# Patient Record
Sex: Female | Born: 1965
Health system: Southern US, Community
[De-identification: ages and names within clinical notes are randomized; demographics above are authoritative.]

## PROBLEM LIST (undated history)

## (undated) DIAGNOSIS — M5126 Other intervertebral disc displacement, lumbar region: Secondary | ICD-10-CM

## (undated) DIAGNOSIS — M5136 Other intervertebral disc degeneration, lumbar region: Secondary | ICD-10-CM

## (undated) DIAGNOSIS — M51369 Other intervertebral disc degeneration, lumbar region without mention of lumbar back pain or lower extremity pain: Secondary | ICD-10-CM

## (undated) DIAGNOSIS — K219 Gastro-esophageal reflux disease without esophagitis: Secondary | ICD-10-CM

## (undated) DIAGNOSIS — C50419 Malignant neoplasm of upper-outer quadrant of unspecified female breast: Principal | ICD-10-CM

## (undated) HISTORY — PX: REDUCTION MAMMAPLASTY: SUR839

## (undated) HISTORY — DX: Other intervertebral disc degeneration, lumbar region: M51.36

## (undated) HISTORY — DX: Malignant neoplasm of upper-outer quadrant of unspecified female breast: C50.419

## (undated) HISTORY — PX: COLONOSCOPY: SHX174

## (undated) HISTORY — DX: Gastro-esophageal reflux disease without esophagitis: K21.9

## (undated) HISTORY — DX: Other intervertebral disc displacement, lumbar region: M51.26

## (undated) HISTORY — PX: OTHER SURGICAL HISTORY: SHX169

## (undated) HISTORY — DX: Other intervertebral disc degeneration, lumbar region without mention of lumbar back pain or lower extremity pain: M51.369

---

## 1995-01-04 HISTORY — PX: BREAST REDUCTION SURGERY: SHX8

## 2001-01-03 HISTORY — PX: CHOLECYSTECTOMY: SHX55

## 2001-01-03 HISTORY — PX: TUBAL LIGATION: SHX77

## 2007-02-16 ENCOUNTER — Ambulatory Visit: Payer: Self-pay | Admitting: Cardiology

## 2011-01-06 DIAGNOSIS — C50419 Malignant neoplasm of upper-outer quadrant of unspecified female breast: Secondary | ICD-10-CM

## 2011-01-06 HISTORY — DX: Malignant neoplasm of upper-outer quadrant of unspecified female breast: C50.419

## 2011-01-10 ENCOUNTER — Telehealth (INDEPENDENT_AMBULATORY_CARE_PROVIDER_SITE_OTHER): Payer: Self-pay | Admitting: Surgery

## 2011-01-10 NOTE — Telephone Encounter (Signed)
Next available date I see is 01/18/11 and that is when patient is scheduled. Dr Jamey Ripa, do you have any time you would like to see her sooner or is a week out okay?

## 2011-01-13 ENCOUNTER — Ambulatory Visit (INDEPENDENT_AMBULATORY_CARE_PROVIDER_SITE_OTHER): Payer: BC Managed Care – PPO | Admitting: Surgery

## 2011-01-13 ENCOUNTER — Encounter (INDEPENDENT_AMBULATORY_CARE_PROVIDER_SITE_OTHER): Payer: Self-pay | Admitting: Surgery

## 2011-01-13 VITALS — BP 128/82 | HR 84 | Temp 98.6°F | Resp 18 | Ht 65.0 in | Wt 185.4 lb

## 2011-01-13 DIAGNOSIS — C50419 Malignant neoplasm of upper-outer quadrant of unspecified female breast: Secondary | ICD-10-CM

## 2011-01-13 NOTE — Telephone Encounter (Signed)
Per Dr Jamey Ripa ok to move appt to Thursday 01/13/11 at 4:00pm. Appt moved and patient made aware.

## 2011-01-13 NOTE — Progress Notes (Signed)
NAME: Sheila Jefferson                                                                                      DOB: 11/20/1965 DATE: 01/13/2011               MRN: [id number]   CC:  Chief Complaint  Patient presents with  . Breast Cancer    HPI:  Sheila Jefferson is a 45 y.o.  female who was referred  by Dr. Howardfor evaluation of A newly diagnosed right breast cancer. The patient noticed a lump near the armpit area about a month ago. She has had inflamed lymph nodes in the past and thought this was another inflamed lymph node. However she did have no fever or pain. It did not get better with time. She called her physician and had a mammogram done. The mammogram showed an abnormality suspicious for cancer with some abnormal lymph nodes in the armpit. Ultrasound confirmed these findings and biopsy has shown invasive carcinoma there still doing studies for receptors and to see if this is Dr. or lobular. The remainder of mammogram was negative.  In 1997 she had bilateral breast reductions. She had a left core biopsy done many years ago for benign process. She's not had any symptoms. She has an aunt who developed postmenopausal breast cancer.  PMH:  has a past medical history of GERD (gastroesophageal reflux disease) and Cancer of upper-outer quadrant of female breast (01/06/2011).  PSH:   has past surgical history that includes Breast reduction surgery (1997) and Cholecystectomy (2003).  ALLERGIES:  Allergies no known allergies  MEDICATIONS: Current outpatient prescriptions:spironolactone (ALDACTONE) 100 MG tablet, Take 100 mg by mouth 2 (two) times daily., Disp: , Rfl:   ROS: She has filled out our 12 point review of systems and it is negative. EXAM:   GENERAL:  The patient is alert, oriented, and generally healthy-appearing, NAD. Mood and affect are normal.  HEENT:  The head is normocephalic, the eyes nonicteric, the pupils were round regular and equal. EOMs are normal. Pharynx normal. Dentition  good.  NECK:  The neck is supple and there are no masses or thyromegaly.  LUNGS: Normal respirations and clear to auscultation.  HEART: Regular rhythm, with no murmurs rubs or gallops. Pulses are intact carotid dorsalis pedis and posterior tibial. No significant varicosities are noted.  BREASTS: There is a hard 3 cm mass in the R UOQ basically in the axilla and some tender axillary nodes. The left side is nl s/p reduction  LYMPHATICS: R Axill nodes, not fixed , <3cm, none on left or supraclavicular  ABDOMEN: Soft, flat, and nontender. No masses or organomegaly is noted. No hernias are noted. Bowel sounds are normal.  EXTREMITIES:  Good range of motion, no edema.   DATA REVIEWED:  Mammogram films and reports, ultrasound and path reports. The prognostic profile and ane- carherin stain are pending  IMPRESSION:  Stage II, right breast cancer  PLAN:   I think we need to have an MRI to be sure there is not another lesion in her right breast or one in the left. The palpable cancer is so high towards   the axilla I'm concerned it could represent a lymph node replaced by cancer and there is a primary someplace else in the breast. In addition, she is likely going to have to have chemotherapy followed like her to have an oncology visit preoperatively so that we could put a port and if needed. Some consideration could also be given for neoadjuvant therapy.  I have told her this is a stage II breast cancer and described the findings for that. I also talked to her about options including lumpectomies x-ray dissections mastectomies reconstructions chemotherapy and radiation therapy.  Currently I think we can do a lumpectomy and node dissection, probably through the same incision.  I put in order in for the MRI and will see if I can get an oncology visit for her fairly soon.  Altovise Wahler J 01/13/2011  CC: Howard, Kevin P, MD, HOWARD,KEVIN P, MD        

## 2011-01-13 NOTE — Patient Instructions (Addendum)
I think the next two things that we need to do our to obtain an MRI exam of her breast to be sure there is nothing that we are missing on either side. As we discussed, most likely  going to need some chemotherapy so I want to have one of the oncologists see you prior to surgery.

## 2011-01-17 ENCOUNTER — Other Ambulatory Visit (INDEPENDENT_AMBULATORY_CARE_PROVIDER_SITE_OTHER): Payer: Self-pay | Admitting: Surgery

## 2011-01-18 ENCOUNTER — Encounter (INDEPENDENT_AMBULATORY_CARE_PROVIDER_SITE_OTHER): Payer: BC Managed Care – PPO | Admitting: Surgery

## 2011-01-19 ENCOUNTER — Ambulatory Visit
Admission: RE | Admit: 2011-01-19 | Discharge: 2011-01-19 | Disposition: A | Discharge status: Home / Self Care | Payer: BC Managed Care – PPO | Source: Ambulatory Visit | Attending: Surgery | Admitting: Surgery

## 2011-01-19 ENCOUNTER — Telehealth (INDEPENDENT_AMBULATORY_CARE_PROVIDER_SITE_OTHER): Payer: Self-pay | Admitting: General Surgery

## 2011-01-19 ENCOUNTER — Telehealth: Payer: Self-pay | Admitting: *Deleted

## 2011-01-19 ENCOUNTER — Encounter (INDEPENDENT_AMBULATORY_CARE_PROVIDER_SITE_OTHER): Payer: Self-pay

## 2011-01-19 DIAGNOSIS — C50419 Malignant neoplasm of upper-outer quadrant of unspecified female breast: Secondary | ICD-10-CM

## 2011-01-19 MED ORDER — GADOBENATE DIMEGLUMINE 529 MG/ML IV SOLN
17.0000 mL | Freq: Once | INTRAVENOUS | Status: AC | PRN
  Administered 2011-01-19: 17 mL via INTRAVENOUS

## 2011-01-19 NOTE — Telephone Encounter (Signed)
Called pt and she said that she would like to see Dr. Mariel Sleet.  I faxed paperwork to Dr. Thornton Papas office to schedule.

## 2011-01-19 NOTE — Telephone Encounter (Signed)
Patient called, she is having MR today and has heard from oncology and scheduled appt with Dr Mariel Sleet 01/31/11. She is wanting to know what her follow up will be like with [place].  After reading her note from Dr Jamey Ripa, I called patient and discussed with her that after her MR the radiologist will read it and get a report to Dr Jamey Ripa and he will call her with the results. She will then see Dr Mariel Sleet to discuss possible neoadjuvant therapy. She will see [place] after she talks with Dr Mariel Sleet. Patient was very frustrated with the slowness of the process and really wants to proceed with treatment. I told her as soon as we get the MR report we will call her.

## 2011-01-20 ENCOUNTER — Telehealth (INDEPENDENT_AMBULATORY_CARE_PROVIDER_SITE_OTHER): Payer: Self-pay | Admitting: Surgery

## 2011-01-20 ENCOUNTER — Other Ambulatory Visit (INDEPENDENT_AMBULATORY_CARE_PROVIDER_SITE_OTHER): Payer: Self-pay | Admitting: Surgery

## 2011-01-20 DIAGNOSIS — N63 Unspecified lump in unspecified breast: Secondary | ICD-10-CM

## 2011-01-20 NOTE — Telephone Encounter (Signed)
Talked to her about MRI report and she remains interested in breast conservation surgery. We will arrange biopsies of the other areas of abnormality

## 2011-01-21 ENCOUNTER — Other Ambulatory Visit (INDEPENDENT_AMBULATORY_CARE_PROVIDER_SITE_OTHER): Payer: Self-pay | Admitting: Surgery

## 2011-01-21 DIAGNOSIS — N63 Unspecified lump in unspecified breast: Secondary | ICD-10-CM

## 2011-01-24 ENCOUNTER — Encounter (INDEPENDENT_AMBULATORY_CARE_PROVIDER_SITE_OTHER): Payer: Self-pay | Admitting: Family Medicine

## 2011-01-25 ENCOUNTER — Ambulatory Visit
Admission: RE | Admit: 2011-01-25 | Discharge: 2011-01-25 | Disposition: A | Discharge status: Home / Self Care | Payer: BC Managed Care – PPO | Source: Ambulatory Visit | Attending: Surgery | Admitting: Surgery

## 2011-01-25 ENCOUNTER — Encounter (INDEPENDENT_AMBULATORY_CARE_PROVIDER_SITE_OTHER): Payer: Self-pay

## 2011-01-25 DIAGNOSIS — N63 Unspecified lump in unspecified breast: Secondary | ICD-10-CM

## 2011-01-25 MED ORDER — GADOBENATE DIMEGLUMINE 529 MG/ML IV SOLN
17.0000 mL | Freq: Once | INTRAVENOUS | Status: AC | PRN
  Administered 2011-01-25: 17 mL via INTRAVENOUS

## 2011-01-31 ENCOUNTER — Encounter (HOSPITAL_COMMUNITY): Payer: Self-pay | Admitting: Oncology

## 2011-01-31 ENCOUNTER — Encounter (HOSPITAL_COMMUNITY): Payer: BC Managed Care – PPO | Attending: Oncology | Admitting: Oncology

## 2011-01-31 ENCOUNTER — Encounter (INDEPENDENT_AMBULATORY_CARE_PROVIDER_SITE_OTHER): Payer: Self-pay | Admitting: Surgery

## 2011-01-31 ENCOUNTER — Other Ambulatory Visit (INDEPENDENT_AMBULATORY_CARE_PROVIDER_SITE_OTHER): Payer: Self-pay | Admitting: Surgery

## 2011-01-31 VITALS — BP 118/75 | HR 85 | Temp 98.0°F | Ht 65.5 in | Wt 188.2 lb

## 2011-01-31 DIAGNOSIS — C50419 Malignant neoplasm of upper-outer quadrant of unspecified female breast: Secondary | ICD-10-CM | POA: Insufficient documentation

## 2011-01-31 DIAGNOSIS — C773 Secondary and unspecified malignant neoplasm of axilla and upper limb lymph nodes: Secondary | ICD-10-CM

## 2011-01-31 LAB — COMPREHENSIVE METABOLIC PANEL
Albumin: 3.6 g/dL (ref 3.5–5.2)
Alkaline Phosphatase: 79 U/L (ref 39–117)
BUN: 12 mg/dL (ref 6–23)
CO2: 24 mEq/L (ref 19–32)
Chloride: 102 mEq/L (ref 96–112)
GFR calc Af Amer: 86 mL/min — ABNORMAL LOW (ref >90)
Glucose, Bld: 98 mg/dL (ref 70–99)
Potassium: 4.1 mEq/L (ref 3.5–5.1)
Total Bilirubin: 0.2 mg/dL — ABNORMAL LOW (ref 0.3–1.2)

## 2011-01-31 LAB — DIFFERENTIAL
Lymphocytes Relative: 31 % (ref 12–46)
Lymphs Abs: 3.9 10*3/uL (ref 0.7–4.0)
Monocytes Relative: 6 % (ref 3–12)
Neutro Abs: 7.7 10*3/uL (ref 1.7–7.7)
Neutrophils Relative %: 62 % (ref 43–77)

## 2011-01-31 LAB — CBC
Hemoglobin: 14.7 g/dL (ref 12.0–15.0)
RBC: 4.63 MIL/uL (ref 3.87–5.11)

## 2011-01-31 NOTE — Progress Notes (Signed)
Discussed with Dr Mariel Sleet - will schedule right lumpectomy and node dissection plus port placement.

## 2011-01-31 NOTE — Progress Notes (Signed)
Sheila Jefferson presented for [employer]. Labs per MD order drawn via Peripheral Line 23 gauge needle inserted in right hand  Good blood return present. Procedure without incident.  Needle removed intact. Patient tolerated procedure well.

## 2011-01-31 NOTE — Progress Notes (Signed)
This office note has been dictated.

## 2011-01-31 NOTE — Progress Notes (Signed)
CC:   Sheila Flavin, MD Sheila Jefferson, M.D. Sheila Jefferson, M.D.  DIAGNOSES: 1. Clinical stage IIA invasive ductal carcinoma of the right breast     with a positive lymph node biopsy for metastatic disease, ER     positive 90%, PR positive 80%, Ki-67 marker 71%, HER-2/neu is non-     amplified. 2. Chronic obstructive pulmonary disease secondary to longstanding     smoking history since her teenage years of just under a pack a day. 3. History of tubal ligation. 4. History of cholecystectomy. 5. History of pilonidal cyst surgery x3, the last in 1983. 6. History of reflux symptomatology on Nexium. 7. Anxiety, acute, most likely secondary to this breast cancer. 8. History of bilateral breast reduction surgery by Dr. Shon Jefferson in     the 1990s.  HISTORY:  This is a very pleasant 46 year old woman who noticed a lump in her right axillary/upper outer quadrant breast area 1-2 weeks before Christmas.  She thought it went away and then while on vacation she found it again, so she came back, had a mammogram with the help of Dr. Dimas Jefferson who then sent her to Dr. Jamey Jefferson after this mass was found suspicious for breast cancer.  She underwent a biopsy, I believe, by Dr. Christiana Jefferson, both of the breast and the axillary node.  She had an MRI which showed 2 other areas.  They were biopsied under MRI guidance. There were both benign in the right breast, one was in the retroareolar region, one was at the 6 o'clock position.  She was referred here today by Dr. Jamey Jefferson for evaluation and possible therapeutic intervention.  She has a family history, her maternal aunt had breast cancer in her 3s and a maternal uncle's daughter had breast cancer in her 59s, so I did discuss with Sheila Jefferson a genetics counseling consultation which she will think about.  She and her husband have been married 26 years.  They have 2 daughters, ages 73 and 91, both in good health.  Sheila Jefferson is a smoker of many, many years  starting in her early teenage years, just under a pack a day is what she admits to.  She does not drink alcohol.  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  Her parents are both deceased.  Her father died of complications of diabetes and renal failure.  Her mother did have vaginal carcinoma in the early 1960s but was cured of that.  She did go on to have cardiac mitral valve surgery x2, and I believe died of complications of heart-related disease.  REVIEW OF SYSTEMS:  Otherwise noncontributory today.  PHYSICAL EXAM:  General:  Pleasant woman who looks her stated age of 36, no acute distress.  She does wear glasses.  Vital signs:  Weight is 188 pounds.  She is 5 feet 5-1/2 inches tall.  BMI is 30.9, blood pressure 118/75, left arm sitting position, pulse 80 and regular, respiratory rate 16 and unlabored.  She is afebrile.  She does have a few rhonchi at the left lower base which were transient.  Otherwise she had mildly diminished breath sounds throughout.  No rubs or rales.  She had no adenopathy in the cervical, supraclavicular, infraclavicular, or axillary areas.  I could not feel definitive node in the right axilla. There was a question of very high in the axilla, but that was questionable only.  In the right breast upper outer quadrant there  is a 2 x 1.5 cm mass that is palpable, very firm, consistent with a tumor and then at the 9 o'clock position just at the edge of the areolar-nipple complex, she has an area of thickening about 2 cm across close to an ecchymosis which I suspect is what this is from the previous biopsy. She actually has 2 ecchymoses on the right breast lower outer quadrant. She has no masses in the left breast.  She has breast reduction surgery scars.  Heart:  Shows a regular rhythm and rate without murmur rub or gallop.  Abdomen:  Soft, nontender without hepatosplenomegaly.  Bowel sounds  decreased but fairly normal.  She has no peripheral edema of the arms, legs.  She is right-handed.  Pulses are intact.  She does have very prominent palmar erythema but again does not drink any alcohol.  I think this lady could easily have a definitive surgical procedure presently with the idea of saving her breast.  She had a lumpectomy and axillary node dissection with Dr. Jamey Jefferson so she is fully staged and I think at her age and with a positive lymph node, she is going to need chemotherapy.  She has a grade 2 tumor.  She has a high Ki-67 marker and even though she is ER/PR positive, I think at her age she warrants chemotherapy and I think dose-dense AC followed by T would be very reasonable.  We will get a baseline bone scan.  Because of her smoking, she should probably have a CT of the chest anyway, so we will do CT chest, abdomen and pelvis, get her back to Dr. Jamey Jefferson who I spoke with today.  His office will be in contact with her about getting surgery set up.  I have asked her to quit smoking.  She will try her best, she states. She understands that her wound healing will definitely be better and she will do better with chemotherapy if she can quit smoking.  We will see her back one way or the other after her definitive surgery, she will let us know.  Then we will also have to set her up with a consultation and we will try to get Dr. Lurline Jefferson to see her in the near future as well.    ______________________________ Sheila Jefferson. Mariel Sleet, MD ESN/MEDQ  D:  01/31/2011  T:  01/31/2011  Job:  [id number]

## 2011-01-31 NOTE — Patient Instructions (Signed)
Sheila Jefferson  [id number] 14-Aug-1965   [facility]  Discharge Instructions  RECOMMENDATIONS MADE BY THE CONSULTANT AND ANY TEST RESULTS WILL BE SENT TO YOUR REFERRING DOCTOR.   EXAM FINDINGS BY MD TODAY AND SIGNS AND SYMPTOMS TO REPORT TO CLINIC OR PRIMARY MD: You will need chemotherapy but we will do it after you have definitive surgery.  Need to do CT scans of your chest, abdomen and pelvis and a bone scan for staging.  Need to stop smoking to help with healing post-operatively and you would tolerate chemotherapy better if you were not smoking.  We can also get you set up for genetic counseling if you  MEDICATIONS PRESCRIBED: none    SPECIAL INSTRUCTIONS/FOLLOW-UP: CT scans of Chest, abdomen and pelvis and bone scan to be done tomorrow.  Call Tobie Lords, RN [phone]) when you know your surgery date so we can schedule a follow-up visit 2 weeks after.    I acknowledge that I have been informed and understand all the instructions given to me and received a copy. I do not have any more questions at this time, but understand that I may call the Specialty Clinic at [facility] at [phone] during business hours should I have any further questions or need assistance in obtaining follow-up care.    __________________________________________  _____________  __________ Signature of Patient or Authorized Representative            Date                   Time    __________________________________________ Nurse's Signature

## 2011-02-01 ENCOUNTER — Encounter (HOSPITAL_COMMUNITY)
Admission: RE | Admit: 2011-02-01 | Discharge: 2011-02-01 | Disposition: A | Discharge status: Home / Self Care | Payer: BC Managed Care – PPO | Source: Ambulatory Visit | Attending: Oncology | Admitting: Oncology

## 2011-02-01 ENCOUNTER — Ambulatory Visit (HOSPITAL_COMMUNITY)
Admission: RE | Admit: 2011-02-01 | Discharge: 2011-02-01 | Disposition: A | Discharge status: Home / Self Care | Payer: BC Managed Care – PPO | Source: Ambulatory Visit | Attending: Oncology | Admitting: Oncology

## 2011-02-01 DIAGNOSIS — C50419 Malignant neoplasm of upper-outer quadrant of unspecified female breast: Secondary | ICD-10-CM

## 2011-02-01 DIAGNOSIS — N83209 Unspecified ovarian cyst, unspecified side: Secondary | ICD-10-CM | POA: Insufficient documentation

## 2011-02-01 DIAGNOSIS — C50919 Malignant neoplasm of unspecified site of unspecified female breast: Secondary | ICD-10-CM | POA: Insufficient documentation

## 2011-02-01 DIAGNOSIS — C773 Secondary and unspecified malignant neoplasm of axilla and upper limb lymph nodes: Secondary | ICD-10-CM | POA: Insufficient documentation

## 2011-02-01 LAB — CANCER ANTIGEN 27.29: CA 27.29: 23 U/mL (ref 0–39)

## 2011-02-01 MED ORDER — TECHNETIUM TC 99M MEDRONATE IV KIT
25.0000 | PACK | Freq: Once | INTRAVENOUS | Status: AC | PRN
  Administered 2011-02-01: 27 via INTRAVENOUS

## 2011-02-01 MED ORDER — IOHEXOL 300 MG/ML  SOLN
100.0000 mL | Freq: Once | INTRAMUSCULAR | Status: AC | PRN
  Administered 2011-02-01: 100 mL via INTRAVENOUS

## 2011-02-03 ENCOUNTER — Telehealth (INDEPENDENT_AMBULATORY_CARE_PROVIDER_SITE_OTHER): Payer: Self-pay | Admitting: General Surgery

## 2011-02-03 NOTE — Telephone Encounter (Signed)
Patient called and left a very frustrated message on my voicemail stating she has been scheduled for surgery 02/23/11. She wants surgery done sooner. Please advise if there is anytime to move patient sooner.

## 2011-02-04 ENCOUNTER — Encounter (HOSPITAL_COMMUNITY): Payer: Self-pay | Admitting: Pharmacy Technician

## 2011-02-04 ENCOUNTER — Telehealth (HOSPITAL_COMMUNITY): Payer: Self-pay

## 2011-02-04 NOTE — Telephone Encounter (Signed)
Patient has been scheduled to see Dr. Mariel Sleet 2 weeks following surgery.

## 2011-02-04 NOTE — Telephone Encounter (Signed)
Call from patient, will be having breast surgery and port placement on 02/14/11.   When does chemotherapy need to be started?

## 2011-02-09 ENCOUNTER — Encounter (HOSPITAL_COMMUNITY): Payer: Self-pay

## 2011-02-09 ENCOUNTER — Encounter (HOSPITAL_COMMUNITY)
Admission: RE | Admit: 2011-02-09 | Discharge: 2011-02-09 | Disposition: A | Discharge status: Home / Self Care | Payer: BC Managed Care – PPO | Source: Ambulatory Visit | Attending: Surgery | Admitting: Surgery

## 2011-02-09 LAB — BASIC METABOLIC PANEL
BUN: 8 mg/dL (ref 6–23)
GFR calc non Af Amer: >90 mL/min (ref >90)
Glucose, Bld: 114 mg/dL — ABNORMAL HIGH (ref 70–99)
Potassium: 4 mEq/L (ref 3.5–5.1)

## 2011-02-09 LAB — HCG, SERUM, QUALITATIVE: Preg, Serum: NEGATIVE

## 2011-02-09 LAB — CBC
MCV: 91.4 fL (ref 78.0–100.0)
Platelets: 275 10*3/uL (ref 150–400)
RDW: 13.6 % (ref 11.5–15.5)
WBC: 12.4 10*3/uL — ABNORMAL HIGH (ref 4.0–10.5)

## 2011-02-09 MED ORDER — CHLORHEXIDINE GLUCONATE 4 % EX LIQD: 1.0000 "application " | Freq: Once | CUTANEOUS | Status: DC

## 2011-02-09 NOTE — Pre-Procedure Instructions (Signed)
18 Sheila Jefferson  02/09/2011   Your procedure is scheduled on:  Feb 11, 2011 (Friday)  Report to [facility] at 1000 AM.  Call this number if you have problems the morning of surgery: [phone]   Remember:   Do not eat food:After Midnight.  May have clear liquids: up to 4 Hours before arrival.  Clear liquids include soda, tea, black coffee, apple or grape juice, broth.  Take these medicines the morning of surgery with A SIP OF WATER: xanax, nexium, vicodin   Do not wear jewelry, make-up or nail polish.  Do not wear lotions, powders, or perfumes. You may wear deodorant.  Do not shave 48 hours prior to surgery.  Do not bring valuables to the hospital.  Contacts, dentures or bridgework may not be worn into surgery.  Leave suitcase in the car. After surgery it may be brought to your room.  For patients admitted to the hospital, checkout time is 11:00 AM the day of discharge.   Patients discharged the day of surgery will not be allowed to drive home.  Name and phone number of your driver: NA  Special Instructions: CHG Shower Use Special Wash: 1/2 bottle night before surgery and 1/2 bottle morning of surgery.   Please read over the following fact sheets that you were given: Pain Booklet, MRSA Information and Surgical Site Infection Prevention

## 2011-02-10 MED ORDER — CEFAZOLIN SODIUM-DEXTROSE 2-3 GM-% IV SOLR
2.0000 g | INTRAVENOUS | Status: AC
  Administered 2011-02-11: 2 g via INTRAVENOUS
  Filled 2011-02-10: qty 50

## 2011-02-11 ENCOUNTER — Encounter (HOSPITAL_COMMUNITY): Payer: Self-pay | Admitting: Anesthesiology

## 2011-02-11 ENCOUNTER — Encounter (HOSPITAL_COMMUNITY): Payer: Self-pay | Admitting: *Deleted

## 2011-02-11 ENCOUNTER — Ambulatory Visit (HOSPITAL_COMMUNITY): Payer: BC Managed Care – PPO

## 2011-02-11 ENCOUNTER — Ambulatory Visit (HOSPITAL_COMMUNITY): Payer: BC Managed Care – PPO | Admitting: Anesthesiology

## 2011-02-11 ENCOUNTER — Encounter (HOSPITAL_COMMUNITY)
Admission: RE | Disposition: A | Discharge status: Home / Self Care | Payer: Self-pay | Source: Ambulatory Visit | Attending: Surgery

## 2011-02-11 ENCOUNTER — Other Ambulatory Visit (INDEPENDENT_AMBULATORY_CARE_PROVIDER_SITE_OTHER): Payer: Self-pay | Admitting: Surgery

## 2011-02-11 ENCOUNTER — Encounter (HOSPITAL_COMMUNITY): Payer: Self-pay | Admitting: General Practice

## 2011-02-11 ENCOUNTER — Ambulatory Visit (HOSPITAL_COMMUNITY)
Admission: RE | Admit: 2011-02-11 | Discharge: 2011-02-12 | Disposition: A | Discharge status: Home / Self Care | Payer: BC Managed Care – PPO | Source: Ambulatory Visit | Attending: Surgery | Admitting: Surgery

## 2011-02-11 DIAGNOSIS — C50419 Malignant neoplasm of upper-outer quadrant of unspecified female breast: Secondary | ICD-10-CM | POA: Insufficient documentation

## 2011-02-11 DIAGNOSIS — Z01812 Encounter for preprocedural laboratory examination: Secondary | ICD-10-CM | POA: Insufficient documentation

## 2011-02-11 DIAGNOSIS — C773 Secondary and unspecified malignant neoplasm of axilla and upper limb lymph nodes: Secondary | ICD-10-CM | POA: Insufficient documentation

## 2011-02-11 HISTORY — PX: PORTACATH PLACEMENT: SHX2246

## 2011-02-11 HISTORY — PX: BREAST LUMPECTOMY: SHX2

## 2011-02-11 SURGERY — BREAST LUMPECTOMY WITH AXILLARY LYMPH NODE DISSECTION
Anesthesia: General | Site: Chest | Laterality: Right | Wound class: Clean

## 2011-02-11 MED ORDER — MIDAZOLAM HCL 2 MG/2ML IJ SOLN: 2.0000 mg | Freq: Once | INTRAMUSCULAR | Status: DC

## 2011-02-11 MED ORDER — LACTATED RINGERS IV SOLN
INTRAVENOUS | Status: DC
  Administered 2011-02-11: 11:00:00 via INTRAVENOUS

## 2011-02-11 MED ORDER — SODIUM CHLORIDE 0.9 % IR SOLN
Status: DC | PRN
  Administered 2011-02-11: 13:00:00

## 2011-02-11 MED ORDER — ALPRAZOLAM 0.5 MG PO TABS: 0.5000 mg | ORAL_TABLET | Freq: Every day | ORAL | Status: DC | PRN

## 2011-02-11 MED ORDER — ONDANSETRON HCL 4 MG/2ML IJ SOLN: 4.0000 mg | Freq: Four times a day (QID) | INTRAMUSCULAR | Status: DC | PRN

## 2011-02-11 MED ORDER — HYDROCODONE-ACETAMINOPHEN 5-325 MG PO TABS
1.0000 | ORAL_TABLET | ORAL | Status: DC | PRN
  Administered 2011-02-11: 2 via ORAL
  Filled 2011-02-11: qty 2

## 2011-02-11 MED ORDER — DEXAMETHASONE SODIUM PHOSPHATE 10 MG/ML IJ SOLN
INTRAMUSCULAR | Status: DC | PRN
  Administered 2011-02-11: 8 mg via INTRAVENOUS

## 2011-02-11 MED ORDER — KCL-LACTATED RINGERS-D5W 20 MEQ/L IV SOLN
INTRAVENOUS | Status: DC
  Administered 2011-02-11 – 2011-02-12 (×2): via INTRAVENOUS
  Filled 2011-02-11 (×3): qty 1000

## 2011-02-11 MED ORDER — HYDROMORPHONE HCL PF 1 MG/ML IJ SOLN: 2.0000 mg | INTRAMUSCULAR | Status: DC | PRN

## 2011-02-11 MED ORDER — FENTANYL CITRATE 0.05 MG/ML IJ SOLN
INTRAMUSCULAR | Status: DC | PRN
  Administered 2011-02-11 (×3): 50 ug via INTRAVENOUS
  Administered 2011-02-11: 100 ug via INTRAVENOUS
  Administered 2011-02-11 (×2): 50 ug via INTRAVENOUS

## 2011-02-11 MED ORDER — LACTATED RINGERS IV SOLN
INTRAVENOUS | Status: DC | PRN
  Administered 2011-02-11 (×2): via INTRAVENOUS

## 2011-02-11 MED ORDER — ONDANSETRON HCL 4 MG/2ML IJ SOLN
INTRAMUSCULAR | Status: DC | PRN
  Administered 2011-02-11: 4 mg via INTRAVENOUS

## 2011-02-11 MED ORDER — PANTOPRAZOLE SODIUM 40 MG PO TBEC
40.0000 mg | DELAYED_RELEASE_TABLET | Freq: Every day | ORAL | Status: DC
  Administered 2011-02-11: 40 mg via ORAL
  Filled 2011-02-11: qty 1

## 2011-02-11 MED ORDER — SPIRONOLACTONE 100 MG PO TABS
100.0000 mg | ORAL_TABLET | Freq: Two times a day (BID) | ORAL | Status: DC
  Administered 2011-02-11: 100 mg via ORAL
  Filled 2011-02-11 (×3): qty 1

## 2011-02-11 MED ORDER — HYDROCODONE-ACETAMINOPHEN 5-325 MG PO TABS: 1.0000 | ORAL_TABLET | ORAL | Status: DC | PRN

## 2011-02-11 MED ORDER — LIDOCAINE HCL (CARDIAC) 20 MG/ML IV SOLN
INTRAVENOUS | Status: DC | PRN
  Administered 2011-02-11: 50 mg via INTRAVENOUS

## 2011-02-11 MED ORDER — MIDAZOLAM HCL 5 MG/5ML IJ SOLN
INTRAMUSCULAR | Status: DC | PRN
  Administered 2011-02-11: 2 mg via INTRAVENOUS

## 2011-02-11 MED ORDER — ONDANSETRON HCL 4 MG PO TABS: 4.0000 mg | ORAL_TABLET | Freq: Four times a day (QID) | ORAL | Status: DC | PRN

## 2011-02-11 MED ORDER — DROPERIDOL 2.5 MG/ML IJ SOLN
INTRAMUSCULAR | Status: DC | PRN
  Administered 2011-02-11: 0.625 mg via INTRAVENOUS

## 2011-02-11 MED ORDER — BUPIVACAINE HCL (PF) 0.25 % IJ SOLN: INTRAMUSCULAR | Status: DC | PRN

## 2011-02-11 MED ORDER — IOHEXOL 300 MG/ML  SOLN
INTRAMUSCULAR | Status: DC | PRN
  Administered 2011-02-11: 1 mL via INTRAVENOUS

## 2011-02-11 MED ORDER — BUPIVACAINE HCL (PF) 0.25 % IJ SOLN
INTRAMUSCULAR | Status: DC | PRN
  Administered 2011-02-11: 30 mL

## 2011-02-11 MED ORDER — PROPOFOL 10 MG/ML IV EMUL
INTRAVENOUS | Status: DC | PRN
  Administered 2011-02-11: 200 mg via INTRAVENOUS

## 2011-02-11 MED ORDER — 0.9 % SODIUM CHLORIDE (POUR BTL) OPTIME
TOPICAL | Status: DC | PRN
  Administered 2011-02-11: 1000 mL

## 2011-02-11 MED ORDER — HEPARIN SOD (PORK) LOCK FLUSH 100 UNIT/ML IV SOLN
INTRAVENOUS | Status: DC | PRN
  Administered 2011-02-11: 500 [IU] via INTRAVENOUS

## 2011-02-11 MED ORDER — HYDROMORPHONE HCL PF 1 MG/ML IJ SOLN
0.2500 mg | INTRAMUSCULAR | Status: DC | PRN
  Administered 2011-02-11: 0.5 mg via INTRAVENOUS
  Administered 2011-02-11: 16:00:00 via INTRAVENOUS

## 2011-02-11 SURGICAL SUPPLY — 76 items
ADH SKN CLS APL DERMABOND .7 (GAUZE/BANDAGES/DRESSINGS) ×2
APPLIER CLIP 9.375 MED OPEN (MISCELLANEOUS) ×3
APPLIER CLIP 9.375 SM OPEN (CLIP) ×3
APR CLP MED 9.3 20 MLT OPN (MISCELLANEOUS) ×1
APR CLP SM 9.3 20 MLT OPN (CLIP) ×1
BAG DECANTER FOR FLEXI CONT (MISCELLANEOUS) ×3 IMPLANT
BINDER BREAST LRG (GAUZE/BANDAGES/DRESSINGS) IMPLANT
BINDER BREAST XLRG (GAUZE/BANDAGES/DRESSINGS) IMPLANT
BIOPATCH RED 1 DISK 7.0 (GAUZE/BANDAGES/DRESSINGS) ×3 IMPLANT
BLADE SURG 10 STRL SS (BLADE) ×3 IMPLANT
BLADE SURG 15 STRL LF DISP TIS (BLADE) ×2 IMPLANT
BLADE SURG 15 STRL SS (BLADE) ×1
BLADE SURG CLIPPER 3M 9600 (MISCELLANEOUS) ×3 IMPLANT
CANISTER SUCTION 2500CC (MISCELLANEOUS) ×3 IMPLANT
CHLORAPREP W/TINT 10.5 ML (MISCELLANEOUS) ×3 IMPLANT
CHLORAPREP W/TINT 26ML (MISCELLANEOUS) ×3 IMPLANT
CLIP APPLIE 9.375 MED OPEN (MISCELLANEOUS) ×2 IMPLANT
CLIP APPLIE 9.375 SM OPEN (CLIP) ×2 IMPLANT
CLOTH BEACON ORANGE TIMEOUT ST (SAFETY) ×3 IMPLANT
CONT SPEC 4OZ CLIKSEAL STRL BL (MISCELLANEOUS) ×6 IMPLANT
COVER SURGICAL LIGHT HANDLE (MISCELLANEOUS) ×6 IMPLANT
CRADLE DONUT ADULT HEAD (MISCELLANEOUS) ×3 IMPLANT
DECANTER SPIKE VIAL GLASS SM (MISCELLANEOUS) ×3 IMPLANT
DERMABOND ADVANCED (GAUZE/BANDAGES/DRESSINGS) ×2
DERMABOND ADVANCED .7 DNX12 (GAUZE/BANDAGES/DRESSINGS) ×4 IMPLANT
DEVICE DUBIN SPECIMEN MAMMOGRA (MISCELLANEOUS) ×3 IMPLANT
DRAIN CHANNEL 19F RND (DRAIN) ×3 IMPLANT
DRAPE C-ARM 42X72 X-RAY (DRAPES) ×3 IMPLANT
DRAPE CHEST BREAST 15X10 FENES (DRAPES) ×3 IMPLANT
DRAPE LAPAROTOMY TRNSV 102X78 (DRAPE) ×3 IMPLANT
DRAPE UTILITY 15X26 W/TAPE STR (DRAPE) ×12 IMPLANT
ELECT CAUTERY BLADE 6.4 (BLADE) ×3 IMPLANT
ELECT REM PT RETURN 9FT ADLT (ELECTROSURGICAL) ×3
ELECTRODE REM PT RTRN 9FT ADLT (ELECTROSURGICAL) ×2 IMPLANT
EVACUATOR SILICONE 100CC (DRAIN) ×3 IMPLANT
GAUZE SPONGE 4X4 16PLY XRAY LF (GAUZE/BANDAGES/DRESSINGS) ×3 IMPLANT
GLOVE BIO SURGEON STRL SZ7.5 (GLOVE) ×9 IMPLANT
GLOVE BIOGEL PI IND STRL 7.0 (GLOVE) ×2 IMPLANT
GLOVE BIOGEL PI IND STRL 7.5 (GLOVE) ×6 IMPLANT
GLOVE BIOGEL PI INDICATOR 7.0 (GLOVE) ×1
GLOVE BIOGEL PI INDICATOR 7.5 (GLOVE) ×3
GLOVE EUDERMIC 7 POWDERFREE (GLOVE) ×3 IMPLANT
GLOVE SURG SS PI 6.5 STRL IVOR (GLOVE) ×3 IMPLANT
GOWN PREVENTION PLUS XLARGE (GOWN DISPOSABLE) ×3 IMPLANT
GOWN STRL NON-REIN LRG LVL3 (GOWN DISPOSABLE) ×9 IMPLANT
INTRODUCER 13FR (MISCELLANEOUS) IMPLANT
INTRODUCER COOK 11FR (CATHETERS) IMPLANT
KIT BASIN OR (CUSTOM PROCEDURE TRAY) ×3 IMPLANT
KIT MARKER MARGIN INK (KITS) ×3 IMPLANT
KIT PORT POWER 9.6FR MRI PREA (Catheter) IMPLANT
KIT PORT POWER ISP 8FR (Catheter) IMPLANT
KIT POWER CATH 8FR (Catheter) ×3 IMPLANT
KIT ROOM TURNOVER OR (KITS) ×3 IMPLANT
NEEDLE HYPO 25GX1X1/2 BEV (NEEDLE) ×6 IMPLANT
NS IRRIG 1000ML POUR BTL (IV SOLUTION) ×3 IMPLANT
PACK SURGICAL SETUP 50X90 (CUSTOM PROCEDURE TRAY) ×3 IMPLANT
PAD ARMBOARD 7.5X6 YLW CONV (MISCELLANEOUS) ×6 IMPLANT
PENCIL BUTTON HOLSTER BLD 10FT (ELECTRODE) ×3 IMPLANT
SET INTRODUCER 12FR PACEMAKER (SHEATH) IMPLANT
SET SHEATH INTRODUCER 10FR (MISCELLANEOUS) IMPLANT
SHEATH COOK PEEL AWAY SET 9F (SHEATH) IMPLANT
SPONGE INTESTINAL PEANUT (DISPOSABLE) ×3 IMPLANT
SPONGE LAP 18X18 X RAY DECT (DISPOSABLE) IMPLANT
SPONGE LAP 4X18 X RAY DECT (DISPOSABLE) ×3 IMPLANT
SUT ETHILON 3 0 FSL (SUTURE) ×3 IMPLANT
SUT MNCRL AB 4-0 PS2 18 (SUTURE) ×3 IMPLANT
SUT MON AB 4-0 PC3 18 (SUTURE) ×6 IMPLANT
SUT PROLENE 2 0 SH DA (SUTURE) ×6 IMPLANT
SUT VIC AB 3-0 SH 18 (SUTURE) ×6 IMPLANT
SYR 20ML ECCENTRIC (SYRINGE) ×6 IMPLANT
SYR 5ML LUER SLIP (SYRINGE) ×3 IMPLANT
SYR CONTROL 10ML LL (SYRINGE) ×6 IMPLANT
SYRINGE 10CC LL (SYRINGE) ×3 IMPLANT
TOWEL OR 17X24 6PK STRL BLUE (TOWEL DISPOSABLE) ×3 IMPLANT
TOWEL OR 17X26 10 PK STRL BLUE (TOWEL DISPOSABLE) ×3 IMPLANT
YANKAUER SUCT BULB TIP NO VENT (SUCTIONS) IMPLANT

## 2011-02-11 NOTE — Anesthesia Preprocedure Evaluation (Addendum)
Anesthesia Evaluation  Patient identified by MRN, date of birth, ID band Patient awake    Reviewed: Allergy & Precautions, H&P , NPO status , Patient's Chart, lab work & pertinent test results  History of Anesthesia Complications Negative for: history of anesthetic complications  Airway Mallampati: II  Neck ROM: full    Dental   Pulmonary Current Smoker (1/2 ppd x 30 years),          Cardiovascular     Neuro/Psych    GI/Hepatic GERD-  Medicated and Controlled,  Endo/Other    Renal/GU      Musculoskeletal   Abdominal   Peds  Hematology   Anesthesia Other Findings   Reproductive/Obstetrics                          Anesthesia Physical Anesthesia Plan  ASA: II  Anesthesia Plan: General   Post-op Pain Management:    Induction: Intravenous  Airway Management Planned: LMA  Additional Equipment:   Intra-op Plan:   Post-operative Plan:   Informed Consent: I have reviewed the patients History and Physical, chart, labs and discussed the procedure including the risks, benefits and alternatives for the proposed anesthesia with the patient or authorized representative who has indicated his/her understanding and acceptance.     Plan Discussed with: CRNA and Surgeon  Anesthesia Plan Comments:         Anesthesia Quick Evaluation

## 2011-02-11 NOTE — Preoperative (Signed)
Beta Blockers   Reason not to administer Beta Blockers:Not Applicable 

## 2011-02-11 NOTE — Interval H&P Note (Signed)
History and Physical Interval Note:  02/11/2011 12:09 PM  Sheila Jefferson  has presented today for surgery, with the diagnosis of right breast cancer  The various methods of treatment have been discussed with the patient and family. After consideration of risks, benefits and other options for treatment, the patient has consented to  Procedure(s): BREAST LUMPECTOMY WITH AXILLARY LYMPH NODE DISSECTION INSERTION PORT-A-CATH as a surgical intervention .  The patients' history has been reviewed, patient examined, no change in status, stable for surgery.  I have reviewed the patients' chart and labs.  Questions were answered to the patient's satisfaction.     Margurete Guaman J

## 2011-02-11 NOTE — Op Note (Signed)
Sheila Jefferson  Feb 06, 1965  [id number]  02/11/2011   Preoperative diagnosis: Breast cancer, right, upper outer quadrant; inadequate venous access for chemotherapy  Postoperative diagnosis: Same  Procedure: Right partial mastectomy with right axillary lymph node dissection; Port-A-Cath placement  Surgeon: [name], MD, FACS  Anesthesia: General  Clinical History and Indications: this patient Presents for a right partial mastectomy with axillary dissection for a known right breast cancer. In addition we plan to place a Port-A-Cath for subsequent chemotherapy.Marland Kitchen  Description of procedure: The patient was seen in the holding area and the plans for the procedure reviewed. The Right breast was marked as the operative side. The patient was taken to the operating room and after satisfactory general anesthesia had been obtained the Right breast was prepped and draped and the timeout was performed.  I made a curvilinear incision centered on the mass. The mass is in the axillary tail of Spence. I raised thin skin flaps towards the axilla and towards the nipple then medial and lateral. Using palpation to keep away from the mass I divided the breast tissue down to the chest wall along the superior edge of the skin flap. I then came around medially and expose the edge of the pectoralis muscle and inferiorly and finally taking the entire thickness of the tissue from the chest wall including fascia.By palpation I was well around the mass in all directions. The specimen mammogram showed a mass in the clip to be within the patient. I spent several minutes irrigating to make sure everything was dry. I put clips in to mark the margins.I elevated some of the breast tissue off of the chest wall inclusive deeper tissue with 3-0 Vicryl, then the more superficial tissue with 3-0 Vicryl, and the skin with 4-0 Monocryl subcuticular and finally Dermabond.  Since she was known to have axillary adenopathy improving  metastases we proceeded next to a node dissection. I made a transverse extra incision somewhat higher so that there was a good bridge of skin between the 2 incisions. I divided tissue down to the chest wall exposed the of the pectoralis muscle and the edge of the latissimus. I the clavipectoral fascia. There several nodes at the edge of the clavipectoral fascia that appeared involved with tumor. These were included in the dissection. I identified the axillary vein as the gastric contents from medial to lateral and superior to inferior. The long thoracic tubercle dorsal nerves were identified and preserved. A separate lymph node was sent it was fairly high up and this was sent and labeled highest axillary lymph node. Final lateral attachments from the latissimus were divided. This appeared to completely axillary dissection.  I irrigated made sure everything was dry. I placed a 19 Blake drain and secured it with a 2-0 nylon. I closed the incision with 3-0 Vicryl, 4 months subcuticular and Dermabond.    The patient was then repositioned with her arms at her side and placed in Trendelenburg For the Port-A-Cath placement. A new prep and drape was done and another timeout was done.I was able to access the left axillary vein on the initial attempt. The guidewire was easily threaded and using fluoroscopy in nature it was in the superior vena cava atrial area. I then made transverse incision in the anterior chest wall and fashioned a pocket for the Port-A-Cath reservoir. This was done with cautery. I then threaded the Port-A-Cath tubing through subcutaneous tunnel from the reservoir site to the guidewire site. The dilator and peel-away sheath were  then advanced over the guidewire using fluoroscopic guidance. I was in the superior vena cava with the peel-away sheath the dilator and guidewire for removed and the catheter advanced approximately 20 cm. Again this was done with fluoroscopy. The peel-away sheath was removed  and the catheter backed out to approximately 19 cm where it seemed to be in the superior vena cava. I put some contrast in the tubing to be sure we had good visualization. The catheter aspirated and flushed easily.  The reservoir was attached the locking mechanism engaged. This aspirated and flushed easily. It was secured to the fascia with 2 sutures of 2-0 Prolene. I then checked with the fluoroscope to make sure that we had good positioning and no kinks everything appeared to be good. The area appeared to be dry. After achieving hemostasis, the incision was closed with 3-0 Vicryl, 4-0 Monocryl subcuticular, and Dermabond. The patient tolerated the procedure well. There were no operative complications. All counts were correct.   EBL: Minimal  [name], MD, FACS 02/11/2011 2:51 PM

## 2011-02-11 NOTE — Transfer of Care (Signed)
Immediate Anesthesia Transfer of Care Note  Patient: [facility]  Procedure(s) Performed:  BREAST LUMPECTOMY WITH AXILLARY LYMPH NODE DISSECTION - RIGHT BREAST LUMPECTOMY WITH RIGHT AXILLARY DISSECTION.; INSERTION PORT-A-CATH - INSERTION PORT-A-CATH LEFT SUBCLAVIAN  Patient Location: PACU  Anesthesia Type: General  Level of Consciousness: awake  Airway & Oxygen Therapy: Patient Spontanous Breathing and Patient connected to nasal cannula oxygen  Post-op Assessment: Report given to PACU RN and Post -op Vital signs reviewed and stable  Post vital signs: stable  Complications: No apparent anesthesia complications

## 2011-02-11 NOTE — H&P (View-Only) (Signed)
NAME: Sheila Jefferson                                                                                      DOB: [birth year] DATE: 01/13/2011               MRN: [id number]   CC:  Chief Complaint  Patient presents with  . Breast Cancer    HPI:  Sheila Jefferson is a 46 y.o.  female who was referred  by Dr. Flonnie Overman evaluation of A newly diagnosed right breast cancer. The patient noticed a lump near the armpit area about a month ago. She has had inflamed lymph nodes in the past and thought this was another inflamed lymph node. However she did have no fever or pain. It did not get better with time. She called her physician and had a mammogram done. The mammogram showed an abnormality suspicious for cancer with some abnormal lymph nodes in the armpit. Ultrasound confirmed these findings and biopsy has shown invasive carcinoma there still doing studies for receptors and to see if this is Dr. or lobular. The remainder of mammogram was negative.  In 1997 she had bilateral breast reductions. She had a left core biopsy done many years ago for benign process. She's not had any symptoms. She has an aunt who developed postmenopausal breast cancer.  PMH:  has a past medical history of GERD (gastroesophageal reflux disease) and Cancer of upper-outer quadrant of female breast (01/06/2011).  PSH:   has past surgical history that includes Breast reduction surgery (1997) and Cholecystectomy (2003).  ALLERGIES:  Allergies no known allergies  MEDICATIONS: Current outpatient prescriptions:spironolactone (ALDACTONE) 100 MG tablet, Take 100 mg by mouth 2 (two) times daily., Disp: , Rfl:   ROS: She has filled out our 12 point review of systems and it is negative. EXAM:   GENERAL:  The patient is alert, oriented, and generally healthy-appearing, NAD. Mood and affect are normal.  HEENT:  The head is normocephalic, the eyes nonicteric, the pupils were round regular and equal. EOMs are normal. Pharynx normal. Dentition  good.  NECK:  The neck is supple and there are no masses or thyromegaly.  LUNGS: Normal respirations and clear to auscultation.  HEART: Regular rhythm, with no murmurs rubs or gallops. Pulses are intact carotid dorsalis pedis and posterior tibial. No significant varicosities are noted.  BREASTS: There is a hard 3 cm mass in the R UOQ basically in the axilla and some tender axillary nodes. The left side is nl s/p reduction  LYMPHATICS: R Axill nodes, not fixed , <3cm, none on left or supraclavicular  ABDOMEN: Soft, flat, and nontender. No masses or organomegaly is noted. No hernias are noted. Bowel sounds are normal.  EXTREMITIES:  Good range of motion, no edema.   DATA REVIEWED:  Mammogram films and reports, ultrasound and path reports. The prognostic profile and ane- carherin stain are pending  IMPRESSION:  Stage II, right breast cancer  PLAN:   I think we need to have an MRI to be sure there is not another lesion in her right breast or one in the left. The palpable cancer is so high towards  the axilla I'm concerned it could represent a lymph node replaced by cancer and there is a primary someplace else in the breast. In addition, she is likely going to have to have chemotherapy followed like her to have an oncology visit preoperatively so that we could put a port and if needed. Some consideration could also be given for neoadjuvant therapy.  I have told her this is a stage II breast cancer and described the findings for that. I also talked to her about options including lumpectomies x-ray dissections mastectomies reconstructions chemotherapy and radiation therapy.  Currently I think we can do a lumpectomy and node dissection, probably through the same incision.  I put in order in for the MRI and will see if I can get an oncology visit for her fairly soon.  Sheila Jefferson 01/13/2011  CC: Criss Rosales, MD, Criss Rosales, MD

## 2011-02-11 NOTE — Anesthesia Postprocedure Evaluation (Signed)
Anesthesia Post Note  Patient: Sheila Jefferson  Procedure(s) Performed:  BREAST LUMPECTOMY WITH AXILLARY LYMPH NODE DISSECTION - RIGHT BREAST LUMPECTOMY WITH RIGHT AXILLARY DISSECTION.; INSERTION PORT-A-CATH - INSERTION PORT-A-CATH LEFT SUBCLAVIAN  Anesthesia type: General  Patient location: PACU  Post pain: Pain level controlled and Adequate analgesia  Post assessment: Post-op Vital signs reviewed, Patient's Cardiovascular Status Stable, Respiratory Function Stable, Patent Airway and Pain level controlled  Last Vitals:  Filed Vitals:   02/11/11 1606  BP:   Pulse: 92  Temp:   Resp: 17    Post vital signs: Reviewed and stable  Level of consciousness: awake, alert  and oriented  Complications: No apparent anesthesia complications

## 2011-02-11 NOTE — Anesthesia Procedure Notes (Addendum)
Date/Time: 02/11/2011 1:07 PM Performed by: Romie Minus   Procedure Name: Intubation Date/Time: 02/11/2011 12:38 PM Performed by: Romie Minus Pre-anesthesia Checklist: Patient identified, Emergency Drugs available, Suction available and Patient being monitored Patient Re-evaluated:Patient Re-evaluated prior to inductionOxygen Delivery Method: Circle System Utilized Preoxygenation: Pre-oxygenation with 100% oxygen Intubation Type: IV induction Ventilation: Mask ventilation without difficulty LMA: LMA inserted LMA Size: 4.0 Tube type: Oral Number of attempts: 1 Tube secured with: Tape Dental Injury: Teeth and Oropharynx as per pre-operative assessment

## 2011-02-12 MED ORDER — HYDROCODONE-ACETAMINOPHEN 5-325 MG PO TABS: 1.0000 | ORAL_TABLET | ORAL | Status: AC | PRN

## 2011-02-12 NOTE — Progress Notes (Signed)
Dc instructions gone over and home medicines gone over. Prescription for pain medicine given. Follow up appointment is to be made. Activity and incisional care gone over. Patient demonstrated drain care and charging the bulb. Patient received breast cancer bag with supplies and instructions. Patient verbalized understanding of all instructions.

## 2011-02-12 NOTE — Discharge Summary (Signed)
Patient ID: BRIANN SARCHET MRN: [id number] DOB/AGE: 03/10/1965 46 y.o.  Admit date: 02/11/2011 Discharge date: 02/12/2011  Procedures:  Right partial mastectomy with right axillary lymph node dissection; Port-A-Cath placement  Consults: None  Reason for Admission:  This is a 46 yo with right breast cancer who presented for partial mastectomy and LND with port-a-cath placement.  Admission Diagnoses: 1. Right breast cancer  Hospital Course: The patient was admitted and taken to the operating room where she underwent the above listed procedure.  She tolerated the procedure well.  Her diet was advanced as tolerated.  She had minimal pain and was doing well on POD#1.  She was felt stable for discharge home at this time.  PE:  Chest: right breast incisions are all c/d/i with dermabond.  Left chest incision from Port-A-Cath is also clean.  Discharge Diagnoses:  1. Right breast cancer, s/p partial mastectomy, axillary node dissection, and port-a-cath placement  Discharge Medications: Medication List  As of 02/12/2011  8:49 AM   STOP taking these medications         HYDROcodone-acetaminophen 5-500 MG per tablet         TAKE these medications         ALDACTONE 100 MG tablet   Generic drug: spironolactone   Take 100 mg by mouth 2 (two) times daily.      ALPRAZolam 0.5 MG tablet   Commonly known as: XANAX   Take 0.5 mg by mouth daily as needed. For sleep      esomeprazole 40 MG capsule   Commonly known as: NEXIUM   Take 40 mg by mouth 2 (two) times daily.      HYDROcodone-acetaminophen 5-325 MG per tablet   Commonly known as: NORCO   Take 1-2 tablets by mouth every 4 (four) hours as needed (As needed for moderate pain).            Discharge Instructions: Follow-up Information    Follow up with [name], MD. Schedule an appointment as soon as possible for a visit in 1 week.   Contact information:   [facility] [address] [place] [address] [phone]          Signed: Letha Cape 02/12/2011, 8:49 AM

## 2011-02-14 ENCOUNTER — Encounter (INDEPENDENT_AMBULATORY_CARE_PROVIDER_SITE_OTHER): Payer: Self-pay | Admitting: Surgery

## 2011-02-14 ENCOUNTER — Encounter (HOSPITAL_COMMUNITY): Payer: Self-pay | Admitting: Surgery

## 2011-02-14 ENCOUNTER — Telehealth (INDEPENDENT_AMBULATORY_CARE_PROVIDER_SITE_OTHER): Payer: Self-pay | Admitting: General Surgery

## 2011-02-14 NOTE — Telephone Encounter (Signed)
Patient called and left voicemail. Needs post op appt and had questions about showering.

## 2011-02-14 NOTE — Telephone Encounter (Signed)
Patient made aware of pathology results. Aware margins negative and one lymph node positive for metastasis. Appt made for 02/21/11 @ 3:30 with Dr Jamey Ripa. Patient aware drain can not get wet. She will keep her follow up and call with any questions prior.

## 2011-02-18 ENCOUNTER — Ambulatory Visit (INDEPENDENT_AMBULATORY_CARE_PROVIDER_SITE_OTHER): Payer: BC Managed Care – PPO | Admitting: General Surgery

## 2011-02-18 ENCOUNTER — Encounter (INDEPENDENT_AMBULATORY_CARE_PROVIDER_SITE_OTHER): Payer: Self-pay | Admitting: General Surgery

## 2011-02-18 VITALS — BP 126/80 | HR 92 | Temp 98.6°F | Resp 16 | Ht 65.0 in | Wt 183.6 lb

## 2011-02-18 DIAGNOSIS — Z09 Encounter for follow-up examination after completed treatment for conditions other than malignant neoplasm: Secondary | ICD-10-CM

## 2011-02-18 NOTE — Progress Notes (Signed)
Procedure: Status post right breast lumpectomy and axillary lymph node dissection, Port-A-Cath insertion in February 8  History of Present Ilness: 46 year old female comes in for unexpected postoperative visit. She is concerned about her drain output. She states that she has had minimal output from her drain for the past 2 days. She also complains of some increased soreness in her right axilla. She denies any fevers or chills. She denies any redness on her incisions. She is not taking any narcotics. She reports a good appetite.  Physical Exam: BP 126/80  Pulse 92  Temp(Src) 98.6 F (37 C) (Temporal)  Resp 16  Ht 5\' 5"  (1.651 m)  Wt 183 lb 9.6 oz (83.28 kg)  BMI 30.55 kg/m2  LMP 01/06/2011  Well-developed well-nourished overweight Caucasian female in no apparent distress Right breast lumpectomy site-upper outer quadrant, incision is clean dry and intact. There are some mild induration underneath the incision. However I cannot appreciate any noticeable hematoma or seroma  Right axillary incision-clean dry and intact. No noticeable seroma or hematoma. Drain is intact and functioning normally.  There is only about 10 cc of serous fluid in drained.  Pathology: Will defer to Dr. Jamey Ripa discussed with the patient  Assessment and Plan: The drain appears to be functioning normally. There appears to be no noticeable fluid collection in her axilla. Since the drains had minimal output for 2 days, we removed the drain today. She was given wound care instructions. I told her she could shower.  Follow Dr. Sherran Needs M. Andrey Campanile, MD, FACS General, Bariatric, & Minimally Invasive Surgery [facility], [place]

## 2011-02-18 NOTE — Patient Instructions (Signed)
You may shower.   Cover old drain site with a small gauze or bandage. Change daily until 'hole' is closed

## 2011-02-21 ENCOUNTER — Encounter (INDEPENDENT_AMBULATORY_CARE_PROVIDER_SITE_OTHER): Payer: Self-pay | Admitting: Surgery

## 2011-02-21 ENCOUNTER — Ambulatory Visit (INDEPENDENT_AMBULATORY_CARE_PROVIDER_SITE_OTHER): Payer: BC Managed Care – PPO | Admitting: Surgery

## 2011-02-21 VITALS — BP 120/70 | HR 90 | Temp 97.7°F | Resp 12 | Wt 182.5 lb

## 2011-02-21 DIAGNOSIS — C50919 Malignant neoplasm of unspecified site of unspecified female breast: Secondary | ICD-10-CM

## 2011-02-21 MED ORDER — DOXYCYCLINE HYCLATE 100 MG PO TABS: 100.0000 mg | ORAL_TABLET | Freq: Two times a day (BID) | ORAL | Status: AC

## 2011-02-21 NOTE — Patient Instructions (Signed)
I would like to see you next week. Let me know if you have any questions about your path report. Go to the ABC class and start to do some of the shoulder exercises.

## 2011-02-21 NOTE — Progress Notes (Signed)
Sheila Jefferson    [id number] 02/21/2011    [birth year]   CC: Post op visit  HPI: The patient returns for post op follow-up. She underwent a Right lumpectomy and axillary dissection on 02/11/11. Over all she feels that she is doing well. She notes numbness down her right arm and some fullness at the lumpectomy site. The drain was removed a few days ago  PE: The incision is healing nicely and there is no evidence of infection or hematoma.I aspirated the lumpectomy site, but only obtained a few cc of serous material..  DATA REVIEWED: Pathology report showed negative margins and 1/16 nodes +  IMPRESSION: Patient doing well.   PLAN: Her next visit will be in one week. She starts chemo in a few days . I am going to empirically start her on doxycycline as I'm concerned about the potential for infection with a small fluid collection and early chemo.

## 2011-02-23 ENCOUNTER — Ambulatory Visit (HOSPITAL_COMMUNITY): Admission: RE | Admit: 2011-02-23 | Payer: BC Managed Care – PPO | Source: Ambulatory Visit | Admitting: Surgery

## 2011-02-23 ENCOUNTER — Encounter (HOSPITAL_COMMUNITY): Admission: RE | Payer: Self-pay | Source: Ambulatory Visit

## 2011-02-23 SURGERY — BREAST LUMPECTOMY WITH AXILLARY LYMPH NODE DISSECTION
Anesthesia: General | Laterality: Right

## 2011-02-25 ENCOUNTER — Other Ambulatory Visit (HOSPITAL_COMMUNITY): Payer: Self-pay | Admitting: *Deleted

## 2011-02-25 ENCOUNTER — Other Ambulatory Visit (HOSPITAL_COMMUNITY): Payer: Self-pay | Admitting: Oncology

## 2011-02-25 DIAGNOSIS — C50419 Malignant neoplasm of upper-outer quadrant of unspecified female breast: Secondary | ICD-10-CM

## 2011-02-25 NOTE — Progress Notes (Signed)
Premeds called in to [facility] on Friday 2/22 @ 1115. Tx plan not built so meds are not showing up in the medication profile.

## 2011-02-27 NOTE — Patient Instructions (Signed)
[facility]   CHEMOTHERAPY INSTRUCTIONS   POTENTIAL SIDE EFFECTS OF TREATMENT: Increased Susceptibility to Infection, Vomiting, Constipation, Red or Pink Urine (with Adriamycin), Hair Thinning, Changes in Character of Skin and Nails (brittleness, dryness,etc.), Pigment Changes (darkening of veins, nail beds, palms of hands, soles of feet, etc.), Bone Marrow Suppression, Abdominal Cramping and Urinary Frequency   SELF IMAGE NEEDS AND REFERRALS MADE: Obtain hair accessories as soon as possible (wigs, scarves, turbans,caps,etc.) and Referral to Look Good, Feel Better consultant   EDUCATIONAL MATERIALS GIVEN AND REVIEWED: Chemotherapy and You and Specific Instructions Sheets Adriamycin and Cytoxan   SELF CARE ACTIVITIES WHILE ON CHEMOTHERAPY: Increase your fluid intake 48 hours prior to treatment and drink at least 2 quarts per day after treatment., No alcohol intake., No aspirin or other medications unless approved by your oncologist., Eat foods that are light and easy to digest., Eat foods at cold or room temperature., No fried, fatty, or spicy foods immediately before or after treatment., Have teeth cleaned professionally before starting treatment. Keep dentures and partial plates clean., Use soft toothbrush and do not use mouthwashes that contain alcohol. Biotene is a good mouthwash that is available at most pharmacies or may be ordered by calling [phone]., Use warm salt water gargles (1 teaspoon salt per 1 quart warm water) before and after meals and at bedtime. Or you may rinse with 2 tablespoons of three -percent hydrogen peroxide mixed in eight ounces of water., Always use sunscreen with SPF (Sun Protection Factor) of 30 or higher., Use your nausea medication as directed to prevent nausea., Use your stool softener or laxative as directed to prevent constipation. and Use your anti-diarrheal medication as directed to stop diarrhea.   MEDICATIONS: You  have been given prescriptions for the following medications: Decadron 4mg  tabs take 2 the morning after chemo and then 2 tabs twice a day for the next two days. Zofran-- take 1 tab twice a day starting on the 3rd day after chemo and continue for 2-3 days or as needed. Ativan 1mg  tabs take 1 every 2-3 hrs as needed for nausea and vomitting. You also have compazine suppositories that you can use 1 every 6 hrs as needed for nausea if the oral medicines don't work. Follow directions on label and May cause drowsiness, don't drive or operate heavy machinery  Chemotherapy Drugs  Adriamycin - bone marrow suppression (low white blood cells/low platelets/low red blood cells), hair loss, nausea/vomiting, fatigue, mouth sores, cardiotoxicity (this is why you will have 2D echoes performed periodically during treatment), sensitivity to light - wear sunscreen and sunglasses. This will turn your urine RED for 24-48 hours. Your urine should clear or turn back to yellow within 48 hours. If your urine doesn't clear/turn yellow, please call the Cancer Center [phone]. Cytoxan - can cause hemorrhagic cystitis (bloody urine) - this chemo irritates your bladder! We need you drinking 64 oz of fluid (preferably water/decaff fluids) 2 days prior to chemo and for up to 4-5 days after chemo. Drink more if you can. Do not hold your urine. Urinate before you go to bed and if you wake up in the middle of the night. This can also cause nausea/vomiting and hair loss.  Listed below are some other over the counter medicines that you may need to keep on hand: Milk of Magnesia - for severe constipation. Take 2-4 tablespoons.  If no BM in 8 hours, may increase to 4-6 tablespoons. If no BM in 8 hours, may  increase to a one time maximum dose of 8 tablespoons. If no BM, call Cancer Center.  Senna S tablets - for mild constipation. May take 2-4 tablets daily as needed for constipation. This tablet may cramp stomach. Colace/Docusate Sodium -  may take up to 6 capsules daily to help soften stool. This will not make you have a bowel movement but will make it softer. This capsule works by pulling more water into your colon.  May take glycerin suppository if needed for constipation. You must cover this suppository in [place] jelly or  Vaseline and insert gently to avoid causing bleeding.  For diarrhea, may take Imodium. If  Imodium is not stopping the diarrhea - please call the Cancer Center and we will instruct you on how to take it differently.  Other Tid Bits:  Please wash your hands for at least 30 seconds using warm soapy water. Handwashing is the #1 way to prevent the spread of germs. Stay away from sick people or people who are getting over a cold. If you develop respiratory systems such as green/yellow mucus production or productive cough or persistent cough let us know and we will see if you need an antibiotic. It is a good idea to keep a pair of gloves on when going into grocery stores/[employer] to decrease your risk of coming into contact with germs on the carts, etc. Carry alcohol hand gel with you at all times and use it frequently if out in public. All foods need to be cooked thoroughly. No raw foods. No medium or undercooked meats, eggs. If your food is cooked medium well, it does not need to be hot pink or saturated with bloody liquid at all. Vegetables and fruits need to be washed/rinsed under the faucet with a dish detergent before being consumed. You can eat raw fruits and vegetables unless we tell you otherwise but it would be best if you cooked them or bought frozen. Do not eat off of salad bars or hot bars unless you really trust the cleanliness of the restaurant. If you need dental work, please let Dr. Mariel Sleet know before you go for your appointment so that we can coordinate the best possible time for you in regards to your chemo regimen. You need to also let your dentist know that you are actively taking chemo. We may need to do labs  prior to your dental appointment. We also want your bowels moving at least every other day. If this is not happening, we need to know so that we can get you on a bowel regimen to help you go.    SYMPTOMS TO REPORT AS SOON AS POSSIBLE AFTER TREATMENT:  FEVER GREATER THAN 100.5 F  CHILLS WITH OR WITHOUT FEVER  NAUSEA AND VOMITING THAT IS NOT CONTROLLED WITH YOUR NAUSEA MEDICATION  UNUSUAL SHORTNESS OF BREATH  UNUSUAL BRUISING OR BLEEDING  TENDERNESS IN MOUTH AND THROAT WITH OR WITHOUT PRESENCE OF ULCERS  URINARY PROBLEMS  BOWEL PROBLEMS  UNUSUAL RASH    Wear comfortable clothing and clothing appropriate for easy access to any Portacath or PICC line. Let us know if there is anything that we can do to make your therapy better!      I have been informed and understand all of the instructions given to me and have received a copy. I have been instructed to call the clinic [phone] or my family physician as soon as possible for continued medical care, if indicated. I do not have any more questions at  this time but understand that I may call the [place] or the Patient Navigator at [phone] during office hours should I have questions or need assistance in obtaining follow-up care.      _________________________________________      _______________     __________ Signature of Patient or Authorized Representative        Date                            Time      _________________________________________ Nurse's Signature

## 2011-02-28 ENCOUNTER — Encounter (HOSPITAL_COMMUNITY): Payer: BC Managed Care – PPO

## 2011-02-28 ENCOUNTER — Ambulatory Visit (HOSPITAL_COMMUNITY)
Admission: RE | Admit: 2011-02-28 | Discharge: 2011-02-28 | Disposition: A | Discharge status: Home / Self Care | Payer: BC Managed Care – PPO | Source: Ambulatory Visit | Attending: Oncology | Admitting: Oncology

## 2011-02-28 ENCOUNTER — Encounter (HOSPITAL_COMMUNITY): Payer: BC Managed Care – PPO | Attending: Oncology | Admitting: Oncology

## 2011-02-28 VITALS — BP 115/79 | HR 93 | Temp 98.2°F | Wt 183.2 lb

## 2011-02-28 DIAGNOSIS — J449 Chronic obstructive pulmonary disease, unspecified: Secondary | ICD-10-CM

## 2011-02-28 DIAGNOSIS — C50419 Malignant neoplasm of upper-outer quadrant of unspecified female breast: Secondary | ICD-10-CM | POA: Insufficient documentation

## 2011-02-28 DIAGNOSIS — C50919 Malignant neoplasm of unspecified site of unspecified female breast: Secondary | ICD-10-CM | POA: Insufficient documentation

## 2011-02-28 DIAGNOSIS — C773 Secondary and unspecified malignant neoplasm of axilla and upper limb lymph nodes: Secondary | ICD-10-CM

## 2011-02-28 DIAGNOSIS — I517 Cardiomegaly: Secondary | ICD-10-CM

## 2011-02-28 DIAGNOSIS — F17200 Nicotine dependence, unspecified, uncomplicated: Secondary | ICD-10-CM | POA: Insufficient documentation

## 2011-02-28 NOTE — Progress Notes (Signed)
CC:   Sheila Flavin, MD Sheila Jefferson, M.D. Sheila Jefferson, M.D.  DIAGNOSIS: 1. Stage II carcinoma of the right breast that is ER positive at 90%,     PR positive 80%, Ki-67 marker high at 71%, HER2/neu negative, with     a 2.5 cm tumor that was grade II and 1 of 16 positive nodes.  She     is status post lumpectomy and axillary node dissection, plus Port-A-     Cath placement, and is about to embark on chemotherapy. 2. Chronic obstructive pulmonary disease, still smoking, and she is     down to half a pack of cigarettes a day. 3. History of tubal ligation. 4. History of cholecystectomy. 5. Pilonidal cyst surgery x3, the last in 1983. 6. Reflux symptomatology on Nexium. 7. Acute anxiety secondary to the breast cancer. 8. Bilateral reduction mammoplasties by Dr. Shon Hough in the 1990s. Sheila Jefferson and her husband are here today.  She is recuperating from the surgery.  Her most recent surgery was 02/11/2011.  She is still having range of motion issues with the right arm, but is set up to see somebody in Physical Therapy at the [facility]. She is feeling good for the most part.  She is cutting down on her cigarettes, only smoking half a pack a day, down from her 1-2 packs a day.  Her labs, of course, were done preoperatively, and of course, we had done some before that.  They were really quite excellent.  PHYSICAL EXAMINATION:  Her Port-A-Cath site looks intact.  Breasts:  The surgical sites in the right axilla and right breast look intact, healing nicely.  Her breasts are firm.  She has lots of fibroglandular changes, and her scars from the bilateral mammoplasty reduction surgeries are well healed.  Lungs:  Clear but with diminished breath sounds.  Heart: Showed a regular rhythm and rate.  lymphatic:  There is no obvious adenopathy palpable in the cervical, supraclavicular, infraclavicular, or axillary areas.  Abdomen:  She does not have  hepatosplenomegaly on exam, either.  Extremities:  She has no peripheral edema, but she does need to work on the range of motion on the right, which is restricted.  We are going to start out with [facility] dose-dense every 14 days for 4 doses and then probably move on to Taxotere.  Hopefully, she will be able to tolerate that a little bit better than Taxol.  We will do that for 4 doses and then go on with radiation therapy.  She has not seen Dr. Michell Heinrich yet, so we will make sure she has that appointment scheduled, and then will go on to tamoxifen therapy.  We went over the side effects.  She knows about her hair, etc., and then we will start treatment tomorrow.    ______________________________ Ladona Horns. Mariel Sleet, MD ESN/MEDQ  D:  02/28/2011  T:  02/28/2011  Job:  [id number]

## 2011-02-28 NOTE — Patient Instructions (Addendum)
[facility]   CHEMOTHERAPY INSTRUCTIONS  Taxotere for 4 cycles   POTENTIAL SIDE EFFECTS OF TREATMENT: Increased Susceptibility to Infection, Vomiting, Constipation, Hair Thinning, Changes in Character of Skin and Nails (brittleness, dryness,etc.), Pigment Changes (darkening of veins, nail beds, palms of hands, soles of feet, etc.) and Bone Marrow Suppression  Specific Side-effects  Taxotere - bone marrow suppression (lowers white blood cells (fight infection), lowers red blood cells (make up your blood), lowers platelets (help blood to clot). This chemo can cause fluid retention. You will be responsible for taking a steroid called Dexamethasone at home prior to and after Taxotere. This steroid will keep you from having fluid retention. Take it whether you think you need it or not. Can cause hair loss, skin/nail changes (darkening of the nail beds, pain where the nail bed meets the skin, loosening of the nail beds, dry skin, palms of hands and soles of feet may darken or get sensitive, nausea/vomiting, paresthesia (numbness or tingling) in extremities - we need to know if this develops, mucositis (inflammation of any mucosal membrane - the mouth, throat), mouth sores, neurotoxicity (loss of memory, headaches, trouble sleeping, etc.), can also cause excessive tear production. Please let us know if any side effect develops.      EDUCATIONAL MATERIALS GIVEN AND REVIEWED: Other  Taxotere    MEDICATIONS: Dexamethasone instructions during Taxotere Take 2 tablets twice a day the day before day of and day after chemo. Use other nausea meds as previously explained.   SYMPTOMS TO REPORT AS SOON AS POSSIBLE AFTER TREATMENT:  FEVER GREATER THAN 100.5 F  CHILLS WITH OR WITHOUT FEVER  NAUSEA AND VOMITING THAT IS NOT CONTROLLED WITH YOUR NAUSEA MEDICATION  UNUSUAL SHORTNESS OF BREATH  UNUSUAL BRUISING OR BLEEDING  TENDERNESS IN MOUTH AND THROAT WITH OR  WITHOUT PRESENCE OF ULCERS  URINARY PROBLEMS  BOWEL PROBLEMS  UNUSUAL RASH    Wear comfortable clothing and clothing appropriate for easy access to any Portacath or PICC line. Let us know if there is anything that we can do to make your therapy better!      I have been informed and understand all of the instructions given to me and have received a copy. I have been instructed to call the clinic [phone] or my family physician as soon as possible for continued medical care, if indicated. I do not have any more questions at this time but understand that I may call the Cancer Center or the Patient Navigator at [phone] during office hours should I have questions or need assistance in obtaining follow-up care.      _________________________________________      _______________     __________ Signature of Patient or Authorized Representative        Date                            Time      _________________________________________ Nurse's Signature         Neulasta - (not chemo) but given 20-24 hours after chemo to boost your body's white blood cells. White blood cells are what protect your body from infection. Chemo kills cancer cells and healthy cells. And so your white blood cells get destroyed in the process. This is why you need the Neulasta injection. The most common side effect reported with this injection is bone pain. Some patients have it and some do not have it at all.  If you develop bone pain, it can occur as soon as a few hours after the injection to a couple of days after the injection. And the bone pain can last several days. Please start taking Aleve or Ibuprofen as prescribed on the bottle for the bone pain. This is the drug of choice. There is no other drug that will work as good as the Aleve or Ibuprofen. Please take this medication with food if you have to take. Also, we need to know if you develop bone pain and what the severity of it is.

## 2011-02-28 NOTE — Progress Notes (Deleted)
This shot will be given on the day after chemo.     Neulasta - (not chemo) but given 20-24 hours after chemo to boost your body's white blood cells. White blood cells are what protect your body from infection. Chemo kills cancer cells and healthy cells. And so your white blood cells get destroyed in the process. This is why you need the Neulasta injection. The most common side effect reported with this injection is bone pain. Some patients have it and some do not have it at all. If you develop bone pain, it can occur as soon as a few hours after the injection to a couple of days after the injection. And the bone pain can last several days. Please start taking Aleve or Ibuprofen as prescribed on the bottle for the bone pain. This is the drug of choice. There is no other drug that will work as good as the Aleve or Ibuprofen. Please take this medication with food if you have to take. Also, we need to know if you develop bone pain and what the severity of it is.

## 2011-02-28 NOTE — Progress Notes (Signed)
Chemo teaching performed and consent signed. Pt's preferred pharmacy is [facility] and prescriptions were forwarded there from the original call to [facility].

## 2011-02-28 NOTE — Progress Notes (Signed)
*  PRELIMINARY RESULTS* Echocardiogram 2D Echocardiogram has been performed.  Sheila Jefferson 02/28/2011, 11:42 AM

## 2011-02-28 NOTE — Progress Notes (Signed)
This office note has been dictated.

## 2011-03-01 ENCOUNTER — Encounter (HOSPITAL_BASED_OUTPATIENT_CLINIC_OR_DEPARTMENT_OTHER): Payer: BC Managed Care – PPO

## 2011-03-01 ENCOUNTER — Telehealth (HOSPITAL_COMMUNITY): Payer: Self-pay | Admitting: Oncology

## 2011-03-01 ENCOUNTER — Ambulatory Visit (HOSPITAL_COMMUNITY): Payer: BC Managed Care – PPO | Admitting: Oncology

## 2011-03-01 VITALS — BP 109/72 | HR 89 | Temp 97.8°F | Ht 65.0 in | Wt 184.4 lb

## 2011-03-01 DIAGNOSIS — C50919 Malignant neoplasm of unspecified site of unspecified female breast: Secondary | ICD-10-CM

## 2011-03-01 DIAGNOSIS — Z5111 Encounter for antineoplastic chemotherapy: Secondary | ICD-10-CM

## 2011-03-01 DIAGNOSIS — C50419 Malignant neoplasm of upper-outer quadrant of unspecified female breast: Secondary | ICD-10-CM

## 2011-03-01 DIAGNOSIS — C773 Secondary and unspecified malignant neoplasm of axilla and upper limb lymph nodes: Secondary | ICD-10-CM

## 2011-03-01 LAB — CBC
HCT: 42 % (ref 36.0–46.0)
Hemoglobin: 14.3 g/dL (ref 12.0–15.0)
MCHC: 34 g/dL (ref 30.0–36.0)
MCV: 90.9 fL (ref 78.0–100.0)
WBC: 11.3 10*3/uL — ABNORMAL HIGH (ref 4.0–10.5)

## 2011-03-01 LAB — DIFFERENTIAL
Eosinophils Relative: 4 % (ref 0–5)
Lymphocytes Relative: 31 % (ref 12–46)
Monocytes Absolute: 0.8 10*3/uL (ref 0.1–1.0)
Monocytes Relative: 7 % (ref 3–12)
Neutro Abs: 6.5 10*3/uL (ref 1.7–7.7)

## 2011-03-01 LAB — COMPREHENSIVE METABOLIC PANEL
BUN: 14 mg/dL (ref 6–23)
CO2: 26 mEq/L (ref 19–32)
Chloride: 103 mEq/L (ref 96–112)
Creatinine, Ser: 0.62 mg/dL (ref 0.50–1.10)
GFR calc Af Amer: >90 mL/min (ref >90)
GFR calc non Af Amer: >90 mL/min (ref >90)
Total Bilirubin: 0.2 mg/dL — ABNORMAL LOW (ref 0.3–1.2)

## 2011-03-01 MED ORDER — SODIUM CHLORIDE 0.9 % IV SOLN
Freq: Once | INTRAVENOUS | Status: AC
  Administered 2011-03-01: 10:00:00 via INTRAVENOUS
  Filled 2011-03-01: qty 5

## 2011-03-01 MED ORDER — HEPARIN SOD (PORK) LOCK FLUSH 100 UNIT/ML IV SOLN
500.0000 [IU] | Freq: Once | INTRAVENOUS | Status: AC | PRN
  Administered 2011-03-01: 500 [IU]
  Filled 2011-03-01: qty 5

## 2011-03-01 MED ORDER — SODIUM CHLORIDE 0.9 % IJ SOLN
10.0000 mL | INTRAMUSCULAR | Status: DC | PRN
  Administered 2011-03-01: 10 mL
  Filled 2011-03-01: qty 10

## 2011-03-01 MED ORDER — SODIUM CHLORIDE 0.9 % IV SOLN: 150.0000 mg | Freq: Once | INTRAVENOUS | Status: DC

## 2011-03-01 MED ORDER — PALONOSETRON HCL INJECTION 0.25 MG/5ML
INTRAVENOUS | Status: AC
  Filled 2011-03-01: qty 5

## 2011-03-01 MED ORDER — SODIUM CHLORIDE 0.9 % IJ SOLN
INTRAMUSCULAR | Status: AC
  Filled 2011-03-01: qty 10

## 2011-03-01 MED ORDER — DOXORUBICIN HCL CHEMO IV INJECTION 2 MG/ML
60.0000 mg/m2 | Freq: Once | INTRAVENOUS | Status: AC
  Administered 2011-03-01: 118 mg via INTRAVENOUS
  Filled 2011-03-01 (×3): qty 59

## 2011-03-01 MED ORDER — SODIUM CHLORIDE 0.9 % IV SOLN
Freq: Once | INTRAVENOUS | Status: AC
  Administered 2011-03-01: 09:00:00 via INTRAVENOUS

## 2011-03-01 MED ORDER — HEPARIN SOD (PORK) LOCK FLUSH 100 UNIT/ML IV SOLN
INTRAVENOUS | Status: AC
  Filled 2011-03-01: qty 5

## 2011-03-01 MED ORDER — DEXAMETHASONE SODIUM PHOSPHATE 4 MG/ML IJ SOLN: 12.0000 mg | Freq: Once | INTRAMUSCULAR | Status: DC

## 2011-03-01 MED ORDER — PALONOSETRON HCL INJECTION 0.25 MG/5ML
0.2500 mg | Freq: Once | INTRAVENOUS | Status: AC
  Administered 2011-03-01: 0.25 mg via INTRAVENOUS

## 2011-03-01 MED ORDER — SODIUM CHLORIDE 0.9 % IV SOLN
600.0000 mg/m2 | Freq: Once | INTRAVENOUS | Status: AC
  Administered 2011-03-01: 1180 mg via INTRAVENOUS
  Filled 2011-03-01 (×2): qty 59

## 2011-03-01 NOTE — Progress Notes (Signed)
Tolerated chemo well. 

## 2011-03-02 ENCOUNTER — Encounter (INDEPENDENT_AMBULATORY_CARE_PROVIDER_SITE_OTHER): Payer: Self-pay | Admitting: Surgery

## 2011-03-02 ENCOUNTER — Encounter (HOSPITAL_BASED_OUTPATIENT_CLINIC_OR_DEPARTMENT_OTHER): Payer: BC Managed Care – PPO

## 2011-03-02 ENCOUNTER — Ambulatory Visit (INDEPENDENT_AMBULATORY_CARE_PROVIDER_SITE_OTHER): Payer: BC Managed Care – PPO | Admitting: Surgery

## 2011-03-02 VITALS — BP 138/78 | HR 104 | Temp 97.6°F | Resp 16 | Ht 65.0 in | Wt 184.0 lb

## 2011-03-02 DIAGNOSIS — Z9889 Other specified postprocedural states: Secondary | ICD-10-CM

## 2011-03-02 DIAGNOSIS — C50419 Malignant neoplasm of upper-outer quadrant of unspecified female breast: Secondary | ICD-10-CM

## 2011-03-02 DIAGNOSIS — C773 Secondary and unspecified malignant neoplasm of axilla and upper limb lymph nodes: Secondary | ICD-10-CM

## 2011-03-02 DIAGNOSIS — Z5189 Encounter for other specified aftercare: Secondary | ICD-10-CM

## 2011-03-02 MED ORDER — PEGFILGRASTIM INJECTION 6 MG/0.6ML
SUBCUTANEOUS | Status: AC
  Filled 2011-03-02: qty 0.6

## 2011-03-02 MED ORDER — PEGFILGRASTIM INJECTION 6 MG/0.6ML
6.0000 mg | Freq: Once | SUBCUTANEOUS | Status: AC
  Administered 2011-03-02: 6 mg via SUBCUTANEOUS

## 2011-03-02 NOTE — Progress Notes (Signed)
Sheila Jefferson presents today for injection per MD orders. Neulasta 6mg  administered SQ in left Abdomen. Administration without incident. Patient tolerated well.

## 2011-03-02 NOTE — Progress Notes (Signed)
Sheila Jefferson    [id number] 03/02/2011    09/14/1965   CC: Post op visit  HPI: The patient returns for post op follow-up. She underwent a Right lumpectomy and axillary dissection on 02/11/11. Over all she feels that she is doing well. She notes numbness down her right arm and some fullness at the lumpectomy site.  PE: The incision is healing nicely and there is no evidence of infection or hematoma.She still has limited shoulder ROM  DATA REVIEWED: Pathology report showed negative margins and 1/16 nodes +  IMPRESSION: Patient doing well.   PLAN:RTC six months. More aggressive ROM work and may need PT evaluation

## 2011-03-07 ENCOUNTER — Encounter (INDEPENDENT_AMBULATORY_CARE_PROVIDER_SITE_OTHER): Payer: BC Managed Care – PPO | Admitting: Surgery

## 2011-03-07 ENCOUNTER — Telehealth (INDEPENDENT_AMBULATORY_CARE_PROVIDER_SITE_OTHER): Payer: Self-pay | Admitting: General Surgery

## 2011-03-07 DIAGNOSIS — I890 Lymphedema, not elsewhere classified: Secondary | ICD-10-CM

## 2011-03-07 NOTE — Telephone Encounter (Signed)
Patient called and left message on my voicemail stating she is having some swelling in her arm. Has not been to ABC class yet. Called patient back. She is scheduled for ABC class on Wednesday. She states swelling not that bad but does notice some tightness in her fingers of right arm. I advised she may need to see physical therapy and be fitted for a sleeve. Please advise.

## 2011-03-07 NOTE — Telephone Encounter (Signed)
Spoke with physical therapy who state they can take a look at her and evaluate and get her over to [facility] if the patient needs a sleeve. I put referral in EPIC so they stated they would call the patient with an appt. I called patient and made her aware I spoke with them and they will call her with an appt. Aware to go to class as well just for education. She agrees with this plan and will call if she has any problems.

## 2011-03-08 ENCOUNTER — Telehealth (HOSPITAL_COMMUNITY): Payer: Self-pay | Admitting: *Deleted

## 2011-03-08 NOTE — Telephone Encounter (Signed)
Pt has been having vaginal bleeding since 02/11/11 (date of surgery). The bleeding is intermittent light and heavy but has been bright red at times, "black" and "clotty" at times.  She sees Dr. Francee Piccolo in [place] for GYN. She is concerned about whether this is normal and is beginning to worry about blood loss. What should I tell her.

## 2011-03-09 ENCOUNTER — Telehealth (HOSPITAL_COMMUNITY): Payer: Self-pay

## 2011-03-09 ENCOUNTER — Telehealth (INDEPENDENT_AMBULATORY_CARE_PROVIDER_SITE_OTHER): Payer: Self-pay | Admitting: General Surgery

## 2011-03-09 NOTE — Telephone Encounter (Signed)
Patient notified to see her GYN.  Verbalized understanding.

## 2011-03-09 NOTE — Telephone Encounter (Signed)
Message copied by Sterling Big on Wed Mar 09, 2011 11:38 AM ------      Message from: Mariel Sleet, ERIC S      Created: Wed Mar 09, 2011  9:41 AM       Needs to see her GyN!!

## 2011-03-09 NOTE — Telephone Encounter (Signed)
Patient has not heard from lymphedema clinic re: appt. Showed up for class today but got the wrong time. I spoke with referral coordinator with [facility] on [facility] and they will check and make sure they have this referral. States they get twenty a day and have not gone through these and will call the patient with an appt. They will contact me if they can not find referral.

## 2011-03-14 ENCOUNTER — Encounter (HOSPITAL_COMMUNITY): Payer: BC Managed Care – PPO

## 2011-03-14 ENCOUNTER — Ambulatory Visit (HOSPITAL_COMMUNITY): Payer: BC Managed Care – PPO | Admitting: Oncology

## 2011-03-14 ENCOUNTER — Ambulatory Visit: Payer: BC Managed Care – PPO | Attending: Surgery | Admitting: Physical Therapy

## 2011-03-14 DIAGNOSIS — M24519 Contracture, unspecified shoulder: Secondary | ICD-10-CM | POA: Insufficient documentation

## 2011-03-14 DIAGNOSIS — I890 Lymphedema, not elsewhere classified: Secondary | ICD-10-CM | POA: Insufficient documentation

## 2011-03-14 DIAGNOSIS — IMO0001 Reserved for inherently not codable concepts without codable children: Secondary | ICD-10-CM | POA: Insufficient documentation

## 2011-03-15 ENCOUNTER — Encounter (HOSPITAL_BASED_OUTPATIENT_CLINIC_OR_DEPARTMENT_OTHER): Payer: BC Managed Care – PPO | Admitting: Oncology

## 2011-03-15 ENCOUNTER — Other Ambulatory Visit (HOSPITAL_COMMUNITY): Payer: BC Managed Care – PPO

## 2011-03-15 ENCOUNTER — Ambulatory Visit (HOSPITAL_COMMUNITY): Payer: BC Managed Care – PPO | Admitting: Oncology

## 2011-03-15 ENCOUNTER — Encounter (HOSPITAL_COMMUNITY): Payer: BC Managed Care – PPO | Attending: Oncology

## 2011-03-15 VITALS — BP 112/77 | HR 90 | Temp 97.5°F

## 2011-03-15 DIAGNOSIS — C773 Secondary and unspecified malignant neoplasm of axilla and upper limb lymph nodes: Secondary | ICD-10-CM

## 2011-03-15 DIAGNOSIS — Z170 Estrogen receptor positive status [ER+]: Secondary | ICD-10-CM

## 2011-03-15 DIAGNOSIS — R5381 Other malaise: Secondary | ICD-10-CM

## 2011-03-15 DIAGNOSIS — Z5111 Encounter for antineoplastic chemotherapy: Secondary | ICD-10-CM

## 2011-03-15 DIAGNOSIS — R110 Nausea: Secondary | ICD-10-CM

## 2011-03-15 DIAGNOSIS — C50419 Malignant neoplasm of upper-outer quadrant of unspecified female breast: Secondary | ICD-10-CM | POA: Insufficient documentation

## 2011-03-15 LAB — CBC
HCT: 40.1 % (ref 36.0–46.0)
Hemoglobin: 13.6 g/dL (ref 12.0–15.0)
MCH: 31.3 pg (ref 26.0–34.0)
MCHC: 33.9 g/dL (ref 30.0–36.0)

## 2011-03-15 LAB — COMPREHENSIVE METABOLIC PANEL
Albumin: 3.3 g/dL — ABNORMAL LOW (ref 3.5–5.2)
BUN: 15 mg/dL (ref 6–23)
Chloride: 106 mEq/L (ref 96–112)
Creatinine, Ser: 0.64 mg/dL (ref 0.50–1.10)
GFR calc Af Amer: >90 mL/min (ref >90)
Glucose, Bld: 123 mg/dL — ABNORMAL HIGH (ref 70–99)
Total Bilirubin: 0.1 mg/dL — ABNORMAL LOW (ref 0.3–1.2)

## 2011-03-15 LAB — DIFFERENTIAL
Basophils Relative: 1 % (ref 0–1)
Eosinophils Absolute: 0.1 10*3/uL (ref 0.0–0.7)
Monocytes Absolute: 0.5 10*3/uL (ref 0.1–1.0)
Monocytes Relative: 5 % (ref 3–12)
Neutro Abs: 5.4 10*3/uL (ref 1.7–7.7)

## 2011-03-15 MED ORDER — HEPARIN SOD (PORK) LOCK FLUSH 100 UNIT/ML IV SOLN
INTRAVENOUS | Status: AC
  Filled 2011-03-15: qty 5

## 2011-03-15 MED ORDER — DOXORUBICIN HCL CHEMO IV INJECTION 2 MG/ML
60.0000 mg/m2 | Freq: Once | INTRAVENOUS | Status: AC
  Administered 2011-03-15: 118 mg via INTRAVENOUS
  Filled 2011-03-15: qty 59

## 2011-03-15 MED ORDER — PALONOSETRON HCL INJECTION 0.25 MG/5ML
0.2500 mg | Freq: Once | INTRAVENOUS | Status: AC
  Administered 2011-03-15: 0.25 mg via INTRAVENOUS

## 2011-03-15 MED ORDER — SODIUM CHLORIDE 0.9 % IV SOLN
600.0000 mg/m2 | Freq: Once | INTRAVENOUS | Status: AC
  Administered 2011-03-15: 1180 mg via INTRAVENOUS
  Filled 2011-03-15: qty 59

## 2011-03-15 MED ORDER — SODIUM CHLORIDE 0.9 % IV SOLN
Freq: Once | INTRAVENOUS | Status: AC
  Administered 2011-03-15: 10:00:00 via INTRAVENOUS
  Filled 2011-03-15: qty 5

## 2011-03-15 MED ORDER — SODIUM CHLORIDE 0.9 % IJ SOLN
10.0000 mL | INTRAMUSCULAR | Status: DC | PRN
Start: 2011-03-15 — End: 2011-03-15
  Administered 2011-03-15: 10 mL
  Filled 2011-03-15: qty 10

## 2011-03-15 MED ORDER — PALONOSETRON HCL INJECTION 0.25 MG/5ML
INTRAVENOUS | Status: AC
  Administered 2011-03-15: 0.25 mg via INTRAVENOUS
  Filled 2011-03-15: qty 5

## 2011-03-15 MED ORDER — HEPARIN SOD (PORK) LOCK FLUSH 100 UNIT/ML IV SOLN
500.0000 [IU] | Freq: Once | INTRAVENOUS | Status: DC | PRN
  Filled 2011-03-15: qty 5

## 2011-03-15 MED ORDER — DEXAMETHASONE SODIUM PHOSPHATE 4 MG/ML IJ SOLN: 12.0000 mg | Freq: Once | INTRAMUSCULAR | Status: DC

## 2011-03-15 MED ORDER — SODIUM CHLORIDE 0.9 % IV SOLN: 150.0000 mg | Freq: Once | INTRAVENOUS | Status: DC

## 2011-03-15 MED ORDER — SODIUM CHLORIDE 0.9 % IV SOLN
Freq: Once | INTRAVENOUS | Status: AC
  Administered 2011-03-15: 09:00:00 via INTRAVENOUS

## 2011-03-15 NOTE — Progress Notes (Signed)
Criss Rosales, MD, MD [address] [place] [address]  1. Cancer of upper-outer quadrant of female breast  CBC, Differential, Basic metabolic panel, Comprehensive metabolic panel    CURRENT THERAPY: S/P 1 cycle of AC chemotherapy.  Completing cycle II today.  S/P right lumpectomy and axillary node dissection on 02/11/2011  INTERVAL HISTORY: Sheila Jefferson 46 y.o. female returns for  regular  visit for followup of Stage II carcinoma of the right breast that is ER positive at 90%, PR positive 80%, Ki-67 marker high at 71%, HER2/neu negative, with a 2.5 cm tumor that was grade II and 1 of 16 positive nodes.   The patient tolerated her first cycle of chemotherapy very well. She reports some fatigue and minimal nausea. She reports that her anti-emetics were very effective in controlling this. She explains that she has a wonderful appetite; "I'll eat anything that's not tied down."  The patient does admit to some constipation over the weekend. She call the clinic and it was recommended that she take some milk of magnesia. This was very effective. She also got some over-the-counter stool softeners as well. Since this constipation episode, her bowels been moving regularly.  Patient reports some vaginal bleeding that began around the time of her lumpectomy surgery which was on 02/11/2011.  She was seen by her gynecologist performed a Pap smear. She returns to him tomorrow to go over the results. Her vaginal bleeding has decreased since seeing him.  This is not appear to be chemotherapy or cancer related.  The patient had an appointment with radiation oncology following her first cycle of chemotherapy. She does not feel well at that time and therefore rescheduled her appointment for sometime at the end of March or beginning of April. She'll be seeing Dr. Michell Heinrich.  The patient reports that she is doing very well. She explains that if all of her subsequent cycles of chemotherapy go as well as her first cycle  she'll be very pleased. She is expecting her hair to follow following her first cycle chemotherapy. I explained to her that she may not experience this side effect until after her second or third administration of chemotherapy.  ROS: No TIA's or unusual headaches, no dysphagia.  No prolonged cough. No dyspnea or chest pain on exertion.  No abdominal pain, change in bowel habits, black or bloody stools.  No urinary tract symptoms.  No new or unusual musculoskeletal symptoms. No new breast lumps, breast pain or nipple discharge.   Past Medical History  Diagnosis Date  . GERD (gastroesophageal reflux disease)   . Bulging lumbar disc   . Cancer of upper-outer quadrant of female breast 01/06/2011    right breast    has Cancer of upper-outer quadrant of female breast on her problem list.      has no known allergies.  Ms. Eagles had no medications administered during this visit.  Past Surgical History  Procedure Date  . Breast reduction surgery 1997  . Cholecystectomy 2003  . Tubal ligation 2003  . Pilonidal cyst     [id number]  . Colonoscopy   . Portacath placement 02/11/2011    Procedure: INSERTION PORT-A-CATH;  Surgeon: [name], MD;  Location: [facility] OR;  Service: General;  Laterality: Left;  INSERTION PORT-A-CATH LEFT SUBCLAVIAN  . Breast lumpectomy 02/11/2011    right breast and axillary dissection    Denies any headaches, dizziness, double vision, fevers, chills, night sweats, nausea, vomiting, diarrhea, constipation, chest pain, heart palpitations, shortness of breath,  blood in stool, black tarry stool, urinary pain, urinary burning, urinary frequency, hematuria.   PHYSICAL EXAMINATION  ECOG PERFORMANCE STATUS: 1 - Symptomatic but completely ambulatory  There were no vitals filed for this visit.  GENERAL:alert, no distress, well nourished, well developed, comfortable, cooperative and smiling.  Patient seen in chemotherapy room receiving her second cycle of  chemotherapy. SKIN: skin color, texture, turgor are normal, no rashes or significant lesions HEAD: Normocephalic, No masses, lesions, tenderness or abnormalities EYES: normal EARS: External ears normal OROPHARYNX:mucous membranes are moist  NECK: supple, trachea midline LYMPH:  not examined BREAST:not examined LUNGS: clear to auscultation and percussion HEART: regular rate & rhythm, no murmurs, no gallops, S1 normal and S2 normal ABDOMEN:abdomen soft, non-tender, obese and normal bowel sounds BACK: Back symmetric, no curvature., No CVA tenderness EXTREMITIES:less then 2 second capillary refill, no joint deformities, effusion, or inflammation, no skin discoloration, no clubbing, no cyanosis  NEURO: alert & oriented x 3 with fluent speech, no focal motor/sensory deficits, gait normal   LABORATORY DATA: CBC    Component Value Date/Time   WBC 8.5 03/15/2011 0836   RBC 4.35 03/15/2011 0836   HGB 13.6 03/15/2011 0836   HCT 40.1 03/15/2011 0836   PLT 202 03/15/2011 0836   MCV 92.2 03/15/2011 0836   MCH 31.3 03/15/2011 0836   MCHC 33.9 03/15/2011 0836   RDW 13.7 03/15/2011 0836   LYMPHSABS 2.5 03/15/2011 0836   MONOABS 0.5 03/15/2011 0836   EOSABS 0.1 03/15/2011 0836   BASOSABS 0.1 03/15/2011 0836      Chemistry      Component Value Date/Time   NA 139 03/15/2011 0836   K 3.8 03/15/2011 0836   CL 106 03/15/2011 0836   CO2 25 03/15/2011 0836   BUN 15 03/15/2011 0836   CREATININE 0.64 03/15/2011 0836      Component Value Date/Time   CALCIUM 9.1 03/15/2011 0836   ALKPHOS 76 03/15/2011 0836   AST 10 03/15/2011 0836   ALT 14 03/15/2011 0836   BILITOT 0.1* 03/15/2011 0836       PATHOLOGY: 02/11/2011  ADDITIONAL INFORMATION: 1. CHROMOGENIC IN-SITU HYBRIDIZATION Interpretation HER-2/NEU BY CISH - NO AMPLIFICATION OF HER-2 DETECTED. THE RATIO OF HER-2: CEP 17 SIGNALS WAS 1.06. Reference range: Ratio: HER2:CEP17 < 1.8 - gene amplification not observed Ratio: HER2:CEP 17 1.8-2.2 - equivocal  result Ratio: HER2:CEP17 > 2.2 - gene amplification observed Pecola Leisure MD Pathologist, Electronic Signature ( Signed 02/18/2011) FINAL DIAGNOSIS Diagnosis 1. Breast, lumpectomy, right - INVASIVE DUCTAL CARCINOMA, NOTTINGHAM COMBINED HISTOLOGIC GRADE II, 2.5 CM. - NO EVIDENCE OF ANGIOLYMPHATIC INVASION IDENTIFIED. - ALL THE RESECTION MARGINS ARE CLEAR. PLEASE SEE ONCOLOGY TEMPLATE FOR DETAIL. 2. Lymph nodes, regional resection, right axillary contents - ONE OF FIFTEEN LYMPH NODES, POSITIVE FOR METASTATIC DUCTAL CARCINOMA (1/15). 3. Lymph node, biopsy, highest right axillary - ONE LYMPH NODE, NEGATIVE FOR METASTATIC CARCINOMA (0/1). Microscopic Comment 1. BREAST, INVASIVE TUMOR, WITH LYMPH NODE SAMPLING 1 of 3 FINAL for CHANESE, [place] ([id number]) Microscopic Comment(continued) Specimen, including laterality: Right breast. Procedure: Right lumpectomy. Grade: II Tubule formation: 3 Nuclear pleomorphism: 2 Mitotic:2 Tumor size (gross measurement): 2.5 cm Margins: Negative Invasive, distance to closest margin: Superior and medial margins; 0.5 cm In-situ, distance to closest margin: N/A Lymphovascular invasion: Not identified. Ductal carcinoma in situ: N/A Lobular neoplasia: N/A Tumor focality: Unifocal Treatment effect: No. If present, treatment effect in breast tissue, lymph nodes or both: No. Extent of tumor: Skin: N/A Nipple: N/A Skeletal muscle: N/A Lymph nodes: #  examined: 16 Lymph nodes with metastasis: 1 Isolated tumor cells (< 0.2 mm): N/A Micrometastasis: (> 0.2 mm and < 2.0 mm): N/A Macrometastasis: (> 2.0 mm): 1 Extracapsular extension: 0 Breast prognostic profile: Per previous report [id number] Estrogen receptor: 90%, positive Progesterone receptor: 80%, positive Her 2 neu: negative by FISH analysis. Her 2 neu will be repeated and an addendum report will follow. Ki-67: 71% Non-neoplastic breast: N/A TNM: pT2, pN1a, pMx (HCL:gt, 02/14/11) Abigail Miyamoto  MD Pathologist, Electronic Signature (Case signed 02/14/2011)    ASSESSMENT:  1. Stage II carcinoma of the right breast that is ER positive at 90%, PR positive 80%, Ki-67 marker high at 71%, HER2/neu negative, with a 2.5 cm tumor that was grade II and 1 of 16 positive nodes. She is status post lumpectomy and axillary node dissection, plus Port-A- Cath placement, and is about to embark on chemotherapy.  She will undergo AC followed by Taxotere in a dose dense fashion, followed by radiation, and then Tamoxifen x 5 years. 2. Chronic obstructive pulmonary disease, still smoking, and she is down to half a pack of cigarettes a day.  3. History of tubal ligation.  4. History of cholecystectomy.  5. Pilonidal cyst surgery x3, the last in 1983.  6. Reflux symptomatology on Nexium.  7. Acute anxiety secondary to the breast cancer.  8. Bilateral reduction mammoplasties by Dr. Shon Hough in the 1990s.    PLAN:  1. Pre-chemo lab work: CBC diff and CMET alternating with BMET. 2. Encouraged the patient to see her Gyn tomorrow as scheduled.  3. Patient will be seen in consultation with Dr. Michell Heinrich at the end of March or beginning of April. 4. Patient education regarding side effects of chemotherapy.  5. Return in 4 weeks for follow-up.   All questions were answered. The patient knows to call the clinic with any problems, questions or concerns. We can certainly see the patient much sooner if necessary.  The patient and plan discussed with Glenford Peers, MD and he is in agreement with the aforementioned.   Maxine Fredman

## 2011-03-15 NOTE — Progress Notes (Signed)
Pt. Tolerating chemo well other than c/o vaginal bleeding that is heavy and persistent. Has seen gyn and goes back tomorrow for results of tests.

## 2011-03-16 ENCOUNTER — Encounter (HOSPITAL_BASED_OUTPATIENT_CLINIC_OR_DEPARTMENT_OTHER): Payer: BC Managed Care – PPO

## 2011-03-16 VITALS — BP 116/76 | HR 97 | Temp 97.6°F

## 2011-03-16 DIAGNOSIS — C50419 Malignant neoplasm of upper-outer quadrant of unspecified female breast: Secondary | ICD-10-CM

## 2011-03-16 DIAGNOSIS — Z5189 Encounter for other specified aftercare: Secondary | ICD-10-CM

## 2011-03-16 DIAGNOSIS — C773 Secondary and unspecified malignant neoplasm of axilla and upper limb lymph nodes: Secondary | ICD-10-CM

## 2011-03-16 MED ORDER — PEGFILGRASTIM INJECTION 6 MG/0.6ML
6.0000 mg | Freq: Once | SUBCUTANEOUS | Status: AC
  Administered 2011-03-16: 6 mg via SUBCUTANEOUS
  Filled 2011-03-16: qty 0.6

## 2011-03-16 NOTE — Progress Notes (Signed)
Tolerated chemo without problems. Saw Dr. Francee Piccolo today and he will do further testing after 3rd cycle of chemo if still bleeding.   Sheila Jefferson presents today for injection per MD orders. Neulasta 6mg  administered SQ in right Abdomen. Administration without incident. Patient tolerated well.

## 2011-03-21 ENCOUNTER — Ambulatory Visit: Payer: BC Managed Care – PPO | Admitting: Physical Therapy

## 2011-03-22 ENCOUNTER — Ambulatory Visit (HOSPITAL_COMMUNITY): Payer: BC Managed Care – PPO | Admitting: Oncology

## 2011-03-28 ENCOUNTER — Ambulatory Visit: Payer: BC Managed Care – PPO | Admitting: Physical Therapy

## 2011-03-28 ENCOUNTER — Encounter (HOSPITAL_COMMUNITY): Payer: BC Managed Care – PPO

## 2011-03-29 ENCOUNTER — Encounter (HOSPITAL_BASED_OUTPATIENT_CLINIC_OR_DEPARTMENT_OTHER): Payer: BC Managed Care – PPO

## 2011-03-29 ENCOUNTER — Encounter (HOSPITAL_COMMUNITY): Payer: BC Managed Care – PPO

## 2011-03-29 ENCOUNTER — Ambulatory Visit (HOSPITAL_COMMUNITY)
Admission: RE | Admit: 2011-03-29 | Discharge: 2011-03-29 | Disposition: A | Discharge status: Home / Self Care | Payer: BC Managed Care – PPO | Source: Ambulatory Visit | Attending: Oncology | Admitting: Oncology

## 2011-03-29 VITALS — BP 114/72 | HR 97 | Temp 97.9°F

## 2011-03-29 DIAGNOSIS — C50419 Malignant neoplasm of upper-outer quadrant of unspecified female breast: Secondary | ICD-10-CM

## 2011-03-29 DIAGNOSIS — Z5111 Encounter for antineoplastic chemotherapy: Secondary | ICD-10-CM

## 2011-03-29 DIAGNOSIS — C773 Secondary and unspecified malignant neoplasm of axilla and upper limb lymph nodes: Secondary | ICD-10-CM

## 2011-03-29 LAB — BASIC METABOLIC PANEL
BUN: 12 mg/dL (ref 6–23)
Creatinine, Ser: 0.68 mg/dL (ref 0.50–1.10)
GFR calc Af Amer: >90 mL/min (ref >90)
GFR calc non Af Amer: >90 mL/min (ref >90)
Potassium: 3.8 mEq/L (ref 3.5–5.1)

## 2011-03-29 LAB — DIFFERENTIAL
Basophils Relative: 1 % (ref 0–1)
Eosinophils Absolute: 0 10*3/uL (ref 0.0–0.7)
Monocytes Relative: 7 % (ref 3–12)
Neutrophils Relative %: 75 % (ref 43–77)

## 2011-03-29 LAB — CBC
Hemoglobin: 12.5 g/dL (ref 12.0–15.0)
MCH: 30.7 pg (ref 26.0–34.0)
MCHC: 33.1 g/dL (ref 30.0–36.0)
RDW: 14.7 % (ref 11.5–15.5)

## 2011-03-29 MED ORDER — DOXORUBICIN HCL CHEMO IV INJECTION 2 MG/ML
60.0000 mg/m2 | Freq: Once | INTRAVENOUS | Status: AC
  Administered 2011-03-29: 118 mg via INTRAVENOUS
  Filled 2011-03-29: qty 59

## 2011-03-29 MED ORDER — SODIUM CHLORIDE 0.9 % IV SOLN
Freq: Once | INTRAVENOUS | Status: AC
  Administered 2011-03-29: 12:00:00 via INTRAVENOUS
  Filled 2011-03-29: qty 5

## 2011-03-29 MED ORDER — PALONOSETRON HCL INJECTION 0.25 MG/5ML
0.2500 mg | Freq: Once | INTRAVENOUS | Status: AC
  Administered 2011-03-29: 0.25 mg via INTRAVENOUS

## 2011-03-29 MED ORDER — SODIUM CHLORIDE 0.9 % IJ SOLN
INTRAMUSCULAR | Status: AC
  Filled 2011-03-29: qty 10

## 2011-03-29 MED ORDER — SODIUM CHLORIDE 0.9 % IV SOLN
Freq: Once | INTRAVENOUS | Status: AC
  Administered 2011-03-29: 500 mL via INTRAVENOUS

## 2011-03-29 MED ORDER — SODIUM CHLORIDE 0.9 % IV SOLN
600.0000 mg/m2 | Freq: Once | INTRAVENOUS | Status: AC
  Administered 2011-03-29: 1180 mg via INTRAVENOUS
  Filled 2011-03-29: qty 59

## 2011-03-29 MED ORDER — PALONOSETRON HCL INJECTION 0.25 MG/5ML
INTRAVENOUS | Status: AC
  Administered 2011-03-29: 0.25 mg via INTRAVENOUS
  Filled 2011-03-29: qty 5

## 2011-03-29 MED ORDER — FOSAPREPITANT DIMEGLUMINE INJECTION 150 MG: 150.0000 mg | Freq: Once | INTRAVENOUS | Status: DC

## 2011-03-29 MED ORDER — HEPARIN SOD (PORK) LOCK FLUSH 100 UNIT/ML IV SOLN
INTRAVENOUS | Status: AC
  Administered 2011-03-29: 500 [IU]
  Filled 2011-03-29: qty 5

## 2011-03-29 MED ORDER — DEXAMETHASONE SODIUM PHOSPHATE 4 MG/ML IJ SOLN: 12.0000 mg | Freq: Once | INTRAMUSCULAR | Status: DC

## 2011-03-29 MED ORDER — HEPARIN SOD (PORK) LOCK FLUSH 100 UNIT/ML IV SOLN
500.0000 [IU] | Freq: Once | INTRAVENOUS | Status: AC | PRN
  Administered 2011-03-29: 500 [IU]
  Filled 2011-03-29: qty 5

## 2011-03-29 MED ORDER — SODIUM CHLORIDE 0.9 % IJ SOLN
INTRAMUSCULAR | Status: AC
  Administered 2011-03-29: 10 mL
  Filled 2011-03-29: qty 10

## 2011-03-29 MED ORDER — SODIUM CHLORIDE 0.9 % IJ SOLN
10.0000 mL | INTRAMUSCULAR | Status: DC | PRN
  Administered 2011-03-29: 10 mL
  Filled 2011-03-29: qty 10

## 2011-03-29 NOTE — Progress Notes (Signed)
03/29/2011 0850 Rhiannon M Boettner's port is not giving blood return.  Samuella Bruin, PA-C advised of same and fluoro ordered; patient to go to radiology at 0945.    03/29/2011 10:45 AM Arneisha M Fonder returned from radiology, fluoro was not performed due to the radiologist getting blood return once the patient presented to him.  Upon patient's return to the [facility], her port was re-flushed and blood return was noted.  03/29/2011 1345 Kortnee M Currie tolerated infusions well and without incident; verbalizes understanding for follow-up.  No distress noted at time of discharge and patient was discharged home with her husband's cousin.  Chapel Judie Petit [place] is aware of need to return tomorrow for neulasta injection.

## 2011-03-30 ENCOUNTER — Encounter (HOSPITAL_COMMUNITY): Payer: BC Managed Care – PPO | Admitting: Oncology

## 2011-03-30 ENCOUNTER — Encounter (HOSPITAL_BASED_OUTPATIENT_CLINIC_OR_DEPARTMENT_OTHER): Payer: BC Managed Care – PPO

## 2011-03-30 DIAGNOSIS — C50419 Malignant neoplasm of upper-outer quadrant of unspecified female breast: Secondary | ICD-10-CM

## 2011-03-30 DIAGNOSIS — Z5189 Encounter for other specified aftercare: Secondary | ICD-10-CM

## 2011-03-30 DIAGNOSIS — C773 Secondary and unspecified malignant neoplasm of axilla and upper limb lymph nodes: Secondary | ICD-10-CM

## 2011-03-30 MED ORDER — PEGFILGRASTIM INJECTION 6 MG/0.6ML
SUBCUTANEOUS | Status: AC
  Filled 2011-03-30: qty 0.6

## 2011-03-30 MED ORDER — HEPARIN SOD (PORK) LOCK FLUSH 100 UNIT/ML IV SOLN
INTRAVENOUS | Status: AC
  Filled 2011-03-30: qty 5

## 2011-03-30 MED ORDER — PEGFILGRASTIM INJECTION 6 MG/0.6ML
6.0000 mg | Freq: Once | SUBCUTANEOUS | Status: AC
  Administered 2011-03-30: 6 mg via SUBCUTANEOUS

## 2011-03-30 NOTE — Progress Notes (Signed)
Tolerated Neulasta injection well. 

## 2011-04-04 ENCOUNTER — Ambulatory Visit: Payer: BC Managed Care – PPO | Admitting: Physical Therapy

## 2011-04-04 NOTE — Progress Notes (Signed)
This encounter was created in error - please disregard.

## 2011-04-05 ENCOUNTER — Ambulatory Visit (HOSPITAL_COMMUNITY)
Admission: RE | Admit: 2011-04-05 | Discharge: 2011-04-05 | Disposition: A | Discharge status: Home / Self Care | Payer: BC Managed Care – PPO | Source: Ambulatory Visit | Attending: Oncology | Admitting: Oncology

## 2011-04-05 DIAGNOSIS — C50419 Malignant neoplasm of upper-outer quadrant of unspecified female breast: Secondary | ICD-10-CM

## 2011-04-05 DIAGNOSIS — C50919 Malignant neoplasm of unspecified site of unspecified female breast: Secondary | ICD-10-CM

## 2011-04-11 ENCOUNTER — Ambulatory Visit: Payer: BC Managed Care – PPO | Attending: Surgery | Admitting: Physical Therapy

## 2011-04-11 ENCOUNTER — Encounter (HOSPITAL_COMMUNITY): Payer: BC Managed Care – PPO

## 2011-04-11 DIAGNOSIS — I890 Lymphedema, not elsewhere classified: Secondary | ICD-10-CM | POA: Insufficient documentation

## 2011-04-11 DIAGNOSIS — M24519 Contracture, unspecified shoulder: Secondary | ICD-10-CM | POA: Insufficient documentation

## 2011-04-11 DIAGNOSIS — IMO0001 Reserved for inherently not codable concepts without codable children: Secondary | ICD-10-CM | POA: Insufficient documentation

## 2011-04-12 ENCOUNTER — Ambulatory Visit (HOSPITAL_COMMUNITY)
Admission: RE | Admit: 2011-04-12 | Discharge: 2011-04-12 | Disposition: A | Discharge status: Home / Self Care | Payer: BC Managed Care – PPO | Source: Ambulatory Visit | Attending: Oncology | Admitting: Oncology

## 2011-04-12 ENCOUNTER — Encounter (HOSPITAL_COMMUNITY): Payer: BC Managed Care – PPO | Attending: Oncology

## 2011-04-12 ENCOUNTER — Encounter (HOSPITAL_COMMUNITY): Payer: BC Managed Care – PPO

## 2011-04-12 VITALS — BP 120/76 | HR 92 | Temp 98.4°F | Wt 188.4 lb

## 2011-04-12 DIAGNOSIS — C50419 Malignant neoplasm of upper-outer quadrant of unspecified female breast: Secondary | ICD-10-CM

## 2011-04-12 DIAGNOSIS — Z9221 Personal history of antineoplastic chemotherapy: Secondary | ICD-10-CM | POA: Insufficient documentation

## 2011-04-12 DIAGNOSIS — C50919 Malignant neoplasm of unspecified site of unspecified female breast: Secondary | ICD-10-CM | POA: Insufficient documentation

## 2011-04-12 DIAGNOSIS — C773 Secondary and unspecified malignant neoplasm of axilla and upper limb lymph nodes: Secondary | ICD-10-CM

## 2011-04-12 DIAGNOSIS — Z09 Encounter for follow-up examination after completed treatment for conditions other than malignant neoplasm: Secondary | ICD-10-CM

## 2011-04-12 DIAGNOSIS — Z5111 Encounter for antineoplastic chemotherapy: Secondary | ICD-10-CM

## 2011-04-12 LAB — COMPREHENSIVE METABOLIC PANEL
ALT: 18 U/L (ref 0–35)
AST: 11 U/L (ref 0–37)
CO2: 27 mEq/L (ref 19–32)
Chloride: 103 mEq/L (ref 96–112)
Creatinine, Ser: 0.64 mg/dL (ref 0.50–1.10)
GFR calc non Af Amer: >90 mL/min (ref >90)
Sodium: 138 mEq/L (ref 135–145)
Total Bilirubin: 0.1 mg/dL — ABNORMAL LOW (ref 0.3–1.2)

## 2011-04-12 LAB — DIFFERENTIAL
Basophils Absolute: 0.1 10*3/uL (ref 0.0–0.1)
Lymphocytes Relative: 23 % (ref 12–46)
Monocytes Absolute: 0.7 10*3/uL (ref 0.1–1.0)
Neutro Abs: 5.2 10*3/uL (ref 1.7–7.7)

## 2011-04-12 LAB — CBC
HCT: 36.4 % (ref 36.0–46.0)
RDW: 15.8 % — ABNORMAL HIGH (ref 11.5–15.5)
WBC: 7.8 10*3/uL (ref 4.0–10.5)

## 2011-04-12 MED ORDER — SODIUM CHLORIDE 0.9 % IJ SOLN
10.0000 mL | INTRAMUSCULAR | Status: DC | PRN
  Filled 2011-04-12: qty 10

## 2011-04-12 MED ORDER — HEPARIN SOD (PORK) LOCK FLUSH 100 UNIT/ML IV SOLN
INTRAVENOUS | Status: AC
  Filled 2011-04-12: qty 5

## 2011-04-12 MED ORDER — HEPARIN SOD (PORK) LOCK FLUSH 100 UNIT/ML IV SOLN
500.0000 [IU] | Freq: Once | INTRAVENOUS | Status: AC | PRN
  Administered 2011-04-12: 500 [IU]
  Filled 2011-04-12: qty 5

## 2011-04-12 MED ORDER — DEXAMETHASONE SODIUM PHOSPHATE 4 MG/ML IJ SOLN: 12.0000 mg | Freq: Once | INTRAMUSCULAR | Status: DC

## 2011-04-12 MED ORDER — FOSAPREPITANT DIMEGLUMINE INJECTION 150 MG
Freq: Once | INTRAVENOUS | Status: AC
  Administered 2011-04-12: 10:00:00 via INTRAVENOUS
  Filled 2011-04-12: qty 5

## 2011-04-12 MED ORDER — SODIUM CHLORIDE 0.9 % IJ SOLN
INTRAMUSCULAR | Status: AC
  Filled 2011-04-12: qty 10

## 2011-04-12 MED ORDER — PALONOSETRON HCL INJECTION 0.25 MG/5ML
INTRAVENOUS | Status: AC
  Filled 2011-04-12: qty 5

## 2011-04-12 MED ORDER — PALONOSETRON HCL INJECTION 0.25 MG/5ML
0.2500 mg | Freq: Once | INTRAVENOUS | Status: AC
  Administered 2011-04-12: 0.25 mg via INTRAVENOUS

## 2011-04-12 MED ORDER — SODIUM CHLORIDE 0.9 % IV SOLN
600.0000 mg/m2 | Freq: Once | INTRAVENOUS | Status: AC
  Administered 2011-04-12: 1180 mg via INTRAVENOUS
  Filled 2011-04-12: qty 59

## 2011-04-12 MED ORDER — SODIUM CHLORIDE 0.9 % IV SOLN: 150.0000 mg | Freq: Once | INTRAVENOUS | Status: DC

## 2011-04-12 MED ORDER — DOXORUBICIN HCL CHEMO IV INJECTION 2 MG/ML
60.0000 mg/m2 | Freq: Once | INTRAVENOUS | Status: AC
  Administered 2011-04-12: 118 mg via INTRAVENOUS
  Filled 2011-04-12: qty 59

## 2011-04-12 MED ORDER — SODIUM CHLORIDE 0.9 % IV SOLN
Freq: Once | INTRAVENOUS | Status: AC
  Administered 2011-04-12: 09:00:00 via INTRAVENOUS

## 2011-04-12 NOTE — Progress Notes (Signed)
Chemo teaching done for taxotere and consent has been signed.

## 2011-04-12 NOTE — Progress Notes (Signed)
*  PRELIMINARY RESULTS* Echocardiogram 2D Echocardiogram has been performed.  Conrad [place] 04/12/2011, 1:16 PM

## 2011-04-13 ENCOUNTER — Encounter (HOSPITAL_BASED_OUTPATIENT_CLINIC_OR_DEPARTMENT_OTHER): Payer: BC Managed Care – PPO

## 2011-04-13 DIAGNOSIS — C50419 Malignant neoplasm of upper-outer quadrant of unspecified female breast: Secondary | ICD-10-CM

## 2011-04-13 DIAGNOSIS — Z5189 Encounter for other specified aftercare: Secondary | ICD-10-CM

## 2011-04-13 DIAGNOSIS — C773 Secondary and unspecified malignant neoplasm of axilla and upper limb lymph nodes: Secondary | ICD-10-CM

## 2011-04-13 MED ORDER — PEGFILGRASTIM INJECTION 6 MG/0.6ML
SUBCUTANEOUS | Status: AC
  Filled 2011-04-13: qty 0.6

## 2011-04-13 MED ORDER — PEGFILGRASTIM INJECTION 6 MG/0.6ML
6.0000 mg | Freq: Once | SUBCUTANEOUS | Status: AC
  Administered 2011-04-13: 6 mg via SUBCUTANEOUS

## 2011-04-13 NOTE — Progress Notes (Signed)
Hagen M Polcyn presents today for injection per MD orders. Neulasta 6mg administered SQ in left Abdomen. Administration without incident. Patient tolerated well.  

## 2011-04-18 ENCOUNTER — Encounter: Payer: BC Managed Care – PPO | Admitting: Physical Therapy

## 2011-04-18 ENCOUNTER — Telehealth (HOSPITAL_COMMUNITY): Payer: Self-pay

## 2011-04-18 NOTE — Telephone Encounter (Signed)
Call from patient with complaints of allergy problems, with runny nose & head being stopped up.  Wants to know what she can take.  Discussed with PA and ok for her to use any OTC allergy remedy such as claritin, allegra, etc.  Patient notified.

## 2011-04-26 ENCOUNTER — Encounter (HOSPITAL_BASED_OUTPATIENT_CLINIC_OR_DEPARTMENT_OTHER): Payer: BC Managed Care – PPO | Admitting: Oncology

## 2011-04-26 ENCOUNTER — Encounter (HOSPITAL_COMMUNITY): Payer: BC Managed Care – PPO

## 2011-04-26 ENCOUNTER — Other Ambulatory Visit (HOSPITAL_COMMUNITY): Payer: Self-pay | Admitting: Oncology

## 2011-04-26 ENCOUNTER — Encounter (HOSPITAL_BASED_OUTPATIENT_CLINIC_OR_DEPARTMENT_OTHER): Payer: BC Managed Care – PPO

## 2011-04-26 VITALS — BP 125/73 | HR 92 | Temp 97.9°F | Wt 190.4 lb

## 2011-04-26 DIAGNOSIS — Z5111 Encounter for antineoplastic chemotherapy: Secondary | ICD-10-CM

## 2011-04-26 DIAGNOSIS — C50419 Malignant neoplasm of upper-outer quadrant of unspecified female breast: Secondary | ICD-10-CM

## 2011-04-26 DIAGNOSIS — R4589 Other symptoms and signs involving emotional state: Secondary | ICD-10-CM

## 2011-04-26 DIAGNOSIS — C773 Secondary and unspecified malignant neoplasm of axilla and upper limb lymph nodes: Secondary | ICD-10-CM

## 2011-04-26 DIAGNOSIS — Z170 Estrogen receptor positive status [ER+]: Secondary | ICD-10-CM

## 2011-04-26 LAB — COMPREHENSIVE METABOLIC PANEL
ALT: 18 U/L (ref 0–35)
CO2: 25 mEq/L (ref 19–32)
Calcium: 9.6 mg/dL (ref 8.4–10.5)
Creatinine, Ser: 0.55 mg/dL (ref 0.50–1.10)
GFR calc Af Amer: >90 mL/min (ref >90)
GFR calc non Af Amer: >90 mL/min (ref >90)
Glucose, Bld: 230 mg/dL — ABNORMAL HIGH (ref 70–99)
Sodium: 138 mEq/L (ref 135–145)
Total Protein: 7 g/dL (ref 6.0–8.3)

## 2011-04-26 LAB — DIFFERENTIAL
Eosinophils Absolute: 0 10*3/uL (ref 0.0–0.7)
Eosinophils Relative: 0 % (ref 0–5)
Lymphocytes Relative: 5 % — ABNORMAL LOW (ref 12–46)
Lymphs Abs: 0.7 10*3/uL (ref 0.7–4.0)
Monocytes Absolute: 0.5 10*3/uL (ref 0.1–1.0)

## 2011-04-26 LAB — CBC
HCT: 37.9 % (ref 36.0–46.0)
MCH: 30.9 pg (ref 26.0–34.0)
MCV: 95.2 fL (ref 78.0–100.0)
Platelets: 252 10*3/uL (ref 150–400)
RBC: 3.98 MIL/uL (ref 3.87–5.11)
RDW: 17 % — ABNORMAL HIGH (ref 11.5–15.5)

## 2011-04-26 MED ORDER — SODIUM CHLORIDE 0.9 % IN NEBU
INHALATION_SOLUTION | RESPIRATORY_TRACT | Status: AC
  Filled 2011-04-26: qty 3

## 2011-04-26 MED ORDER — DOCETAXEL CHEMO INJECTION 160 MG/16ML
75.0000 mg/m2 | Freq: Once | INTRAVENOUS | Status: AC
  Administered 2011-04-26: 150 mg via INTRAVENOUS
  Filled 2011-04-26: qty 15

## 2011-04-26 MED ORDER — SODIUM CHLORIDE 0.9 % IJ SOLN
INTRAMUSCULAR | Status: AC
  Filled 2011-04-26: qty 10

## 2011-04-26 MED ORDER — SODIUM CHLORIDE 0.9 % IV SOLN
Freq: Once | INTRAVENOUS | Status: AC
  Administered 2011-04-26: 09:00:00 via INTRAVENOUS

## 2011-04-26 MED ORDER — SODIUM CHLORIDE 0.9 % IJ SOLN
10.0000 mL | INTRAMUSCULAR | Status: DC | PRN
  Administered 2011-04-26: 10 mL
  Filled 2011-04-26: qty 10

## 2011-04-26 MED ORDER — SODIUM CHLORIDE 0.9 % IV SOLN: 8.0000 mg | Freq: Once | INTRAVENOUS | Status: DC

## 2011-04-26 MED ORDER — HEPARIN SOD (PORK) LOCK FLUSH 100 UNIT/ML IV SOLN
250.0000 [IU] | Freq: Once | INTRAVENOUS | Status: DC | PRN
  Filled 2011-04-26: qty 5

## 2011-04-26 MED ORDER — DEXAMETHASONE SODIUM PHOSPHATE 10 MG/ML IJ SOLN
10.0000 mg | Freq: Once | INTRAMUSCULAR | Status: DC
Start: 2011-04-26 — End: 2011-04-26

## 2011-04-26 MED ORDER — HEPARIN SOD (PORK) LOCK FLUSH 100 UNIT/ML IV SOLN
INTRAVENOUS | Status: AC
  Filled 2011-04-26: qty 5

## 2011-04-26 MED ORDER — HEPARIN SOD (PORK) LOCK FLUSH 100 UNIT/ML IV SOLN
500.0000 [IU] | Freq: Once | INTRAVENOUS | Status: AC | PRN
  Administered 2011-04-26: 500 [IU]
  Filled 2011-04-26: qty 5

## 2011-04-26 MED ORDER — SODIUM CHLORIDE 0.9 % IV SOLN
Freq: Once | INTRAVENOUS | Status: AC
  Administered 2011-04-26: 8 mg via INTRAVENOUS
  Filled 2011-04-26: qty 4

## 2011-04-26 MED ORDER — HEPARIN SOD (PORK) LOCK FLUSH 100 UNIT/ML IV SOLN
500.0000 [IU] | Freq: Once | INTRAVENOUS | Status: AC
  Administered 2011-04-26: 500 [IU] via INTRAVENOUS
  Filled 2011-04-26: qty 5

## 2011-04-26 NOTE — Progress Notes (Signed)
Tolerated taxotere well.

## 2011-04-26 NOTE — Progress Notes (Signed)
Sheila Jefferson tolerated first 5 minutes of infusion well, no s/s of reaction noted or reported.

## 2011-04-26 NOTE — Progress Notes (Signed)
Sheila Rosales, MD, MD [address] [place] [address]  1. Cancer of upper-outer quadrant of female breast     CURRENT THERAPY:S/P 4 cycle of AC chemotherapy. Now embarking on cycle 1 of Taxotere. S/P right lumpectomy and axillary node dissection on 02/11/2011   INTERVAL HISTORY: Sheila Jefferson 46 y.o. female returns for  regular  visit for followup of Stage II carcinoma of the right breast that is ER positive at 90%, PR positive 80%, Ki-67 marker high at 71%, HER2/neu negative, with a 2.5 cm tumor that was grade II and 1 of 16 positive nodes.   The patient is anxious about this being her first cycle of Taxotere. She explains that because of the new chemotherapy she is slightly worried. Specimen time with the patient reviewing the side effects of Taxotere which includes, but is not limited to, peripheral neuropathy, continued alopecia, change in taste, extremity discomfort, and changes in fingernails.  She understands these side effects and remembers these from her chemotherapy teaching.  She doesn't that she had knee/lower extremity pain from the Neulasta injection she experienced during her Adriamycin, Cytoxan chemotherapy.  She does admit that she gets "emotional" the week of her chemotherapy. She explains that this is sized on its own. She's not interested in any antidepressant therapy at this point time.  Specimen time with the patient discussing how to treat her lower tremor he discomfort if she expresses up from the Taxotere chemotherapy. I suggested she utilizes Aleve one tablet 4 times a day. She reports that she will try Motrin instead.  Other than the patient's anxiety regarding this being her first cycle of Taxotere therapy, she is doing very well. She denies any complaints.   Past Medical History  Diagnosis Date  . GERD (gastroesophageal reflux disease)   . Bulging lumbar disc   . Cancer of upper-outer quadrant of female breast 01/06/2011    right breast    has Cancer of  upper-outer quadrant of female breast on her problem list.      has no known allergies.  Ms. Kuehl had no medications administered during this visit.  Past Surgical History  Procedure Date  . Breast reduction surgery 1997  . Cholecystectomy 2003  . Tubal ligation 2003  . Pilonidal cyst     1980-81-83  . Colonoscopy   . Portacath placement 02/11/2011    Procedure: INSERTION PORT-A-CATH;  Surgeon: [name], MD;  Location: [facility] OR;  Service: General;  Laterality: Left;  INSERTION PORT-A-CATH LEFT SUBCLAVIAN  . Breast lumpectomy 02/11/2011    right breast and axillary dissection    Denies any headaches, dizziness, double vision, fevers, chills, night sweats, nausea, vomiting, diarrhea, constipation, chest pain, heart palpitations, shortness of breath, blood in stool, black tarry stool, urinary pain, urinary burning, urinary frequency, hematuria.   PHYSICAL EXAMINATION  ECOG PERFORMANCE STATUS: 1 - Symptomatic but completely ambulatory  There were no vitals filed for this visit.  GENERAL:alert, no distress, well nourished, well developed, comfortable, cooperative, obese, smiling and alopecia.  Patient seen in the chemotherapy room beginning her first cycle of Taxotere chemotherapy. SKIN: skin color, texture, turgor are normal, no rashes or significant lesions HEAD: Normocephalic, No masses, lesions, tenderness or abnormalities EYES: normal, EOMI, Conjunctiva are pink and non-injected EARS: External ears normal OROPHARYNX:lips, buccal mucosa, and tongue normal and mucous membranes are moist  NECK: supple, trachea midline LYMPH:  not examined BREAST:not examined LUNGS: clear to auscultation  HEART: regular rate & rhythm, no murmurs, no gallops,  S1 normal and S2 normal ABDOMEN:abdomen soft, non-tender, obese and normal bowel sounds BACK: Back symmetric, no curvature., No CVA tenderness EXTREMITIES:less then 2 second capillary refill, no joint deformities, effusion, or  inflammation, no edema, no skin discoloration, no clubbing, no cyanosis  NEURO: alert & oriented x 3 with fluent speech, no focal motor/sensory deficits, gait normal   LABORATORY DATA: CBC    Component Value Date/Time   WBC 14.2* 04/26/2011 0828   RBC 3.98 04/26/2011 0828   HGB 12.3 04/26/2011 0828   HCT 37.9 04/26/2011 0828   PLT 252 04/26/2011 0828   MCV 95.2 04/26/2011 0828   MCH 30.9 04/26/2011 0828   MCHC 32.5 04/26/2011 0828   RDW 17.0* 04/26/2011 0828   LYMPHSABS 0.7 04/26/2011 0828   MONOABS 0.5 04/26/2011 0828   EOSABS 0.0 04/26/2011 0828   BASOSABS 0.0 04/26/2011 0828      Chemistry      Component Value Date/Time   NA 138 04/26/2011 0828   K 4.3 04/26/2011 0828   CL 101 04/26/2011 0828   CO2 25 04/26/2011 0828   BUN 11 04/26/2011 0828   CREATININE 0.55 04/26/2011 0828      Component Value Date/Time   CALCIUM 9.6 04/26/2011 0828   ALKPHOS 77 04/26/2011 0828   AST 12 04/26/2011 0828   ALT 18 04/26/2011 0828   BILITOT 0.1* 04/26/2011 0828       PATHOLOGY: 02/11/2011  ADDITIONAL INFORMATION:  1. CHROMOGENIC IN-SITU HYBRIDIZATION  Interpretation  HER-2/NEU BY CISH - NO AMPLIFICATION OF HER-2 DETECTED. THE RATIO OF HER-2: CEP 17  SIGNALS WAS 1.06.  Reference range:  Ratio: HER2:CEP17 < 1.8 - gene amplification not observed  Ratio: HER2:CEP 17 1.8-2.2 - equivocal result  Ratio: HER2:CEP17 > 2.2 - gene amplification observed  Pecola Leisure MD  Pathologist, Electronic Signature  ( Signed 02/18/2011)  FINAL DIAGNOSIS  Diagnosis  1. Breast, lumpectomy, right  - INVASIVE DUCTAL CARCINOMA, NOTTINGHAM COMBINED HISTOLOGIC GRADE II, 2.5 CM.  - NO EVIDENCE OF ANGIOLYMPHATIC INVASION IDENTIFIED.  - ALL THE RESECTION MARGINS ARE CLEAR. PLEASE SEE ONCOLOGY TEMPLATE FOR  DETAIL.  2. Lymph nodes, regional resection, right axillary contents  - ONE OF FIFTEEN LYMPH NODES, POSITIVE FOR METASTATIC DUCTAL CARCINOMA  (1/15).  3. Lymph node, biopsy, highest right axillary  - ONE LYMPH NODE,  NEGATIVE FOR METASTATIC CARCINOMA (0/1).  Microscopic Comment  1. BREAST, INVASIVE TUMOR, WITH LYMPH NODE SAMPLING  1 of 3  FINAL for Sheila Jefferson, Sheila Jefferson ([id number])  Microscopic Comment(continued)  Specimen, including laterality: Right breast.  Procedure: Right lumpectomy.  Grade: II  Tubule formation: 3  Nuclear pleomorphism: 2  Mitotic:2  Tumor size (gross measurement): 2.5 cm  Margins: Negative  Invasive, distance to closest margin: Superior and medial margins; 0.5 cm  In-situ, distance to closest margin: N/A  Lymphovascular invasion: Not identified.  Ductal carcinoma in situ: N/A  Lobular neoplasia: N/A  Tumor focality: Unifocal  Treatment effect: No.  If present, treatment effect in breast tissue, lymph nodes or both: No.  Extent of tumor:  Skin: N/A  Nipple: N/A  Skeletal muscle: N/A  Lymph nodes:  # examined: 16  Lymph nodes with metastasis: 1  Isolated tumor cells (< 0.2 mm): N/A  Micrometastasis: (> 0.2 mm and < 2.0 mm): N/A  Macrometastasis: (> 2.0 mm): 1  Extracapsular extension: 0  Breast prognostic profile: Per previous report [id number]  Estrogen receptor: 90%, positive  Progesterone receptor: 80%, positive  Her 2 neu: negative by FISH analysis. Her 2  neu will be repeated and an addendum report will follow.  Ki-67: 71%  Non-neoplastic breast: N/A  TNM: pT2, pN1a, pMx (HCL:gt, 02/14/11)  Abigail Miyamoto MD  Pathologist, Electronic Signature  (Case signed 02/14/2011)    ASSESSMENT:  1. Stage II carcinoma of the right breast that is ER positive at 90%, PR positive 80%, Ki-67 marker high at 71%, HER2/neu negative, with a 2.5 cm tumor that was grade II and 1 of 16 positive nodes. She is status post lumpectomy and axillary node dissection, plus Port-A- Cath placement.  She will undergo AC followed by Taxotere in a dose dense fashion, followed by radiation, and then Tamoxifen x 5 years.  2. Chronic obstructive pulmonary disease, still smoking, and she is down to half a  pack of cigarettes a day.  3. History of tubal ligation.  4. History of cholecystectomy.  5. Pilonidal cyst surgery x3, the last in 1983.  6. Reflux symptomatology on Nexium.  7. Acute anxiety secondary to the breast cancer.  8. Bilateral reduction mammoplasties by Dr. Shon Hough in the 1990s   PLAN:  1. I personally reviewed and went over laboratory results with the patient. 2. Discussed patient education regarding Taxotere chemotherapy. 3. Discuss side effects of Taxotere chemotherapy.  We discussed a pain regimen consisting of Aleve or Motrin if she experiences any discomfort.  4. Pre-chemo lab work ordered as standing orders. 5. Return for follow-up in 2 weeks.   All questions were answered. The patient knows to call the clinic with any problems, questions or concerns. We can certainly see the patient much sooner if necessary.  The patient and plan discussed with Glenford Peers, MD and he is in agreement with the aforementioned.  Sheila Jefferson

## 2011-04-27 ENCOUNTER — Encounter (HOSPITAL_BASED_OUTPATIENT_CLINIC_OR_DEPARTMENT_OTHER): Payer: BC Managed Care – PPO

## 2011-04-27 DIAGNOSIS — Z5189 Encounter for other specified aftercare: Secondary | ICD-10-CM

## 2011-04-27 DIAGNOSIS — C50419 Malignant neoplasm of upper-outer quadrant of unspecified female breast: Secondary | ICD-10-CM

## 2011-04-27 DIAGNOSIS — C773 Secondary and unspecified malignant neoplasm of axilla and upper limb lymph nodes: Secondary | ICD-10-CM

## 2011-04-27 MED ORDER — PEGFILGRASTIM INJECTION 6 MG/0.6ML
SUBCUTANEOUS | Status: AC
  Filled 2011-04-27: qty 0.6

## 2011-04-27 MED ORDER — PEGFILGRASTIM INJECTION 6 MG/0.6ML
6.0000 mg | Freq: Once | SUBCUTANEOUS | Status: AC
  Administered 2011-04-27: 6 mg via SUBCUTANEOUS

## 2011-04-27 NOTE — Progress Notes (Signed)
Sheila Jefferson presents today for injection per MD orders. Neulasta 6mg  administered SQ in left Abdomen. Administration without incident. Patient tolerated well.

## 2011-05-02 ENCOUNTER — Telehealth (HOSPITAL_COMMUNITY): Payer: Self-pay | Admitting: *Deleted

## 2011-05-02 NOTE — Telephone Encounter (Signed)
Pt called with several complaints post first treatment----bone pain, dry mouth, tongue swelling, and diarrhea. She is going to start imodium and artificial saliva, push fluids and call me 05/03/11.

## 2011-05-03 ENCOUNTER — Encounter (HOSPITAL_COMMUNITY): Payer: BC Managed Care – PPO

## 2011-05-03 ENCOUNTER — Encounter (HOSPITAL_BASED_OUTPATIENT_CLINIC_OR_DEPARTMENT_OTHER): Payer: BC Managed Care – PPO | Admitting: Oncology

## 2011-05-03 VITALS — BP 112/80 | HR 109 | Temp 98.8°F | Ht 65.0 in | Wt 189.8 lb

## 2011-05-03 DIAGNOSIS — C773 Secondary and unspecified malignant neoplasm of axilla and upper limb lymph nodes: Secondary | ICD-10-CM

## 2011-05-03 DIAGNOSIS — Z170 Estrogen receptor positive status [ER+]: Secondary | ICD-10-CM

## 2011-05-03 DIAGNOSIS — J449 Chronic obstructive pulmonary disease, unspecified: Secondary | ICD-10-CM

## 2011-05-03 DIAGNOSIS — C50419 Malignant neoplasm of upper-outer quadrant of unspecified female breast: Secondary | ICD-10-CM

## 2011-05-03 NOTE — Patient Instructions (Signed)
[facility]  Discharge Instructions HEIDEMARIE GOODNOW  [id number] 06-May-1965 Dr. Glenford Peers  RECOMMENDATIONS MADE BY THE CONSULTANT AND ANY TEST RESULTS WILL BE SENT TO YOUR REFERRING DOCTOR.   EXAM FINDINGS BY MD TODAY AND SIGNS AND SYMPTOMS TO REPORT TO CLINIC OR PRIMARY MD:    INSTRUCTIONS GIVEN AND DISCUSSED: Try ice chips, hard candy Biotene oral balance or mouth spray  You can take aleve 4 times a day lortab every 8 hrs.   SPECIAL INSTRUCTIONS/FOLLOW-UP: Other appts as scheduled   I acknowledge that I have been informed and understand all the instructions given to me and received a copy. I do not have any more questions at this time, but understand that I may call the [facility]ty [facility] at [facility] at [phone] during business hours should I have any further questions or need assistance in obtaining follow-up care.    __________________________________________  _____________  __________ Signature of Patient or Authorized Representative            Date                   Time    __________________________________________ Nurse's Signature

## 2011-05-03 NOTE — Progress Notes (Signed)
Patient is a work in today for mouth dryness and diarrhea. Using imodium. 2 bm's today,  Had too many to count yesterday. Bone pain is getting better but her mouth is really worrying her.

## 2011-05-03 NOTE — Progress Notes (Signed)
[name], MD, MD [address] [place] [address]  1. Cancer of upper-outer quadrant of female breast     CURRENT THERAPY:S/P 4 cycle of AC chemotherapy and now S/P cycle 1 of Taxotere. S/P right lumpectomy and axillary node dissection on 02/11/2011    INTERVAL HISTORY: Reggie M Futch 46 y.o. female returns for  regular  visit for followup of Stage II carcinoma of the right breast that is ER positive at 90%, PR positive 80%, Ki-67 marker high at 71%, HER2/neu negative, with a 2.5 cm tumor that was grade II and 1 of 16 positive nodes.   Mersades is here today to discuss how her first cycle Taxotere chemotherapy went. When she was receiving her Taxotere chemotherapy, she was very worried about this being her first cycle and a new chemotherapy that she's never experienced before. We spoke briefly about side effects associated with Taxotere. She is here today discuss how her first cycle Taxotere chemotherapy went.  The patient is very frustrated today. She reports that "I feel like it had every side effect associated with this chemotherapy."  Initially, the patient is very upset and a sclerotic old. She reports a story about experiencing some acid reflux-like symptoms on Wednesday following her infusion. She took 3 Nexium and Mylanta per her primary care physician's recommendations. This resolved with the aforementioned medications. Thursday, she started having lower extremity pain. She did take Aleve for this discomfort with no benefit. Thursday night at the Friday, she notes some time swelling and severe dryness of her mouth. She reports that she knows her tongue was swollen because she had indentation on the side of her tongue from where her teeth are. She continues today with dry mouth and very anxious about her subsequent chemotherapy cycle of Taxotere.  So we spent some time discussing her symptoms. Her acid reflux or GERD it was likely related to the Taxotere and and its associated dexamethasone  antiemetic medication. I've encouraged her to utilize the same regimen she utilize on this cycle if she has these symptoms again. In regards to her dry mouth, she is practicing good dental hygiene and utilizing Biotene mouthwash. I suggested she utilizes Biotene oral balance or Biotene mouth spray as artificial saliva.  She may also try ice chips and hard candy to help with her salivation. The patient is very concerned about having ulceration of the mouth. I therefore the patient to keep [place] informed if she develops any ulcers in the mouth. We can treat this symptomatically.  In regards to her lower extremity discomfort following Taxotere, we will treat her pain aggressively. I've asked her to take Aleve 4 times a day and Lortab every 8 hours as needed for her lower extremity the discomfort associated with the Taxotere chemotherapy. She will try this, but she repeat she is very worried about her next cycle of Taxotere.  During discussion, the patient repeated how angry she is. She reports "I am so angry I could punch someone."  We talked about this at your. She reports that she is very uncomfortable with her husband performing all the household activities because she is ill. She explains that he works 12 hours a day and never complains about having to perform household activities and duties that she used to performed. She is very thankful for this but she does not like seeing in half to work so hard to help care for her and her family. She reports her appetite is poor. It does improve before the  next cycle of chemotherapy. She is anxious about being well enough to participate in her daughters Prom.  She is upset that she missed her other daughter's first home run in a softball game because she did not feel well.  During discussion of all this the patient broke down in multiple times with 2 years. I explained to her that she may require a therapeutic cry every once in a while with this stress that she is  experiencing.  I told the patient she is more than welcome to call me anytime or come in to the clinic to be seen. I get the sense that she is now on her family to see her tearful because she wants to be the strong role model in the family.    So, Makaley has a lot of personal stress and an uneasiness about her inability to perform household activities and duties. She is emotional during discussion. I wonder if a lot of her symptoms are related to the stress and anxiety. I've made multiple suggestions that following the plan regards for dry mouth lower extremity pain. She is worried about continuing therapy, but she is willing to pursue her next cycle of Taxotere and see how that cycle goes with the changes we've made with the pain medication and suggestions regarding the dry mouth.   Past Medical History  Diagnosis Date  . GERD (gastroesophageal reflux disease)   . Bulging lumbar disc   . Cancer of upper-outer quadrant of female breast 01/06/2011    right breast    has Cancer of upper-outer quadrant of female breast on her problem list.      has no known allergies.  Ms. Cutbirth does not currently have medications on file.  Past Surgical History  Procedure Date  . Breast reduction surgery 1997  . Cholecystectomy 2003  . Tubal ligation 2003  . Pilonidal cyst     1980-81-83  . Colonoscopy   . Portacath placement 02/11/2011    Procedure: INSERTION PORT-A-CATH;  Surgeon: [name], MD;  Location: [facility] OR;  Service: General;  Laterality: Left;  INSERTION PORT-A-CATH LEFT SUBCLAVIAN  . Breast lumpectomy 02/11/2011    right breast and axillary dissection    Denies any headaches, dizziness, double vision, fevers, chills, night sweats, nausea, vomiting, diarrhea, constipation, chest pain, heart palpitations, shortness of breath, blood in stool, black tarry stool, urinary pain, urinary burning, urinary frequency, hematuria.   PHYSICAL EXAMINATION  ECOG PERFORMANCE STATUS: 1 - Symptomatic but  completely ambulatory  Filed Vitals:   05/03/11 0931  BP: 112/80  Pulse: 109  Temp:     GENERAL:alert, no distress, well nourished, well developed, comfortable, cooperative and obese SKIN: skin color, texture, turgor are normal, no rashes or significant lesions HEAD: Normocephalic, No masses, lesions, tenderness or abnormalities EYES: normal, EOMI, Conjunctiva are pink and non-injected EARS: External ears normal OROPHARYNX:lips, buccal mucosa, and tongue normal and mucous membranes are moist  NECK: supple, trachea midline LYMPH:  not examined BREAST:not examined LUNGS: clear to auscultation  HEART: regular rate & rhythm, no murmurs, no gallops, S1 normal and S2 normal ABDOMEN:abdomen soft, non-tender and normal bowel sounds BACK: Back symmetric, no curvature. EXTREMITIES:less then 2 second capillary refill, no skin discoloration, no clubbing, no cyanosis  NEURO: alert & oriented x 3 with fluent speech, no focal motor/sensory deficits, gait normal   LABORATORY DATA: CBC    Component Value Date/Time   WBC 14.2* 04/26/2011 0828   RBC 3.98 04/26/2011 0828   HGB 12.3 04/26/2011  0828   HCT 37.9 04/26/2011 0828   PLT 252 04/26/2011 0828   MCV 95.2 04/26/2011 0828   MCH 30.9 04/26/2011 0828   MCHC 32.5 04/26/2011 0828   RDW 17.0* 04/26/2011 0828   LYMPHSABS 0.7 04/26/2011 0828   MONOABS 0.5 04/26/2011 0828   EOSABS 0.0 04/26/2011 0828   BASOSABS 0.0 04/26/2011 0828      Chemistry      Component Value Date/Time   NA 138 04/26/2011 0828   K 4.3 04/26/2011 0828   CL 101 04/26/2011 0828   CO2 25 04/26/2011 0828   BUN 11 04/26/2011 0828   CREATININE 0.55 04/26/2011 0828      Component Value Date/Time   CALCIUM 9.6 04/26/2011 0828   ALKPHOS 77 04/26/2011 0828   AST 12 04/26/2011 0828   ALT 18 04/26/2011 0828   BILITOT 0.1* 04/26/2011 0828      PATHOLOGY: 02/11/2011  ADDITIONAL INFORMATION:  1. CHROMOGENIC IN-SITU HYBRIDIZATION  Interpretation  HER-2/NEU BY CISH - NO AMPLIFICATION OF  HER-2 DETECTED. THE RATIO OF HER-2: CEP 17  SIGNALS WAS 1.06.  Reference range:  Ratio: HER2:CEP17 < 1.8 - gene amplification not observed  Ratio: HER2:CEP 17 1.8-2.2 - equivocal result  Ratio: HER2:CEP17 > 2.2 - gene amplification observed  Pecola Leisure MD  Pathologist, Electronic Signature  ( Signed 02/18/2011)  FINAL DIAGNOSIS  Diagnosis  1. Breast, lumpectomy, right  - INVASIVE DUCTAL CARCINOMA, NOTTINGHAM COMBINED HISTOLOGIC GRADE II, 2.5 CM.  - NO EVIDENCE OF ANGIOLYMPHATIC INVASION IDENTIFIED.  - ALL THE RESECTION MARGINS ARE CLEAR. PLEASE SEE ONCOLOGY TEMPLATE FOR  DETAIL.  2. Lymph nodes, regional resection, right axillary contents  - ONE OF FIFTEEN LYMPH NODES, POSITIVE FOR METASTATIC DUCTAL CARCINOMA  (1/15).  3. Lymph node, biopsy, highest right axillary  - ONE LYMPH NODE, NEGATIVE FOR METASTATIC CARCINOMA (0/1).  Microscopic Comment  1. BREAST, INVASIVE TUMOR, WITH LYMPH NODE SAMPLING  1 of 3  FINAL for DIANTHA, PAXSON ([id number])  Microscopic Comment(continued)  Specimen, including laterality: Right breast.  Procedure: Right lumpectomy.  Grade: II  Tubule formation: 3  Nuclear pleomorphism: 2  Mitotic:2  Tumor size (gross measurement): 2.5 cm  Margins: Negative  Invasive, distance to closest margin: Superior and medial margins; 0.5 cm  In-situ, distance to closest margin: N/A  Lymphovascular invasion: Not identified.  Ductal carcinoma in situ: N/A  Lobular neoplasia: N/A  Tumor focality: Unifocal  Treatment effect: No.  If present, treatment effect in breast tissue, lymph nodes or both: No.  Extent of tumor:  Skin: N/A  Nipple: N/A  Skeletal muscle: N/A  Lymph nodes:  # examined: 16  Lymph nodes with metastasis: 1  Isolated tumor cells (< 0.2 mm): N/A  Micrometastasis: (> 0.2 mm and < 2.0 mm): N/A  Macrometastasis: (> 2.0 mm): 1  Extracapsular extension: 0  Breast prognostic profile: Per previous report [id number]  Estrogen receptor: 90%, positive    Progesterone receptor: 80%, positive  Her 2 neu: negative by FISH analysis. Her 2 neu will be repeated and an addendum report will follow.  Ki-67: 71%  Non-neoplastic breast: N/A  TNM: pT2, pN1a, pMx (HCL:gt, 02/14/11)  Abigail Miyamoto MD  Pathologist, Electronic Signature  (Case signed 02/14/2011)    ASSESSMENT:  1. Stage II carcinoma of the right breast that is ER positive at 90%, PR positive 80%, Ki-67 marker high at 71%, HER2/neu negative, with a 2.5 cm tumor that was grade II and 1 of 16 positive nodes. She is status post lumpectomy  and axillary node dissection, plus Port-A- Cath placement. She will undergo AC followed by Taxotere in a dose dense fashion, followed by radiation, and then Tamoxifen x 5 years.  2. LE pain, secondary to Taxotere 3. Dry mouth 4. Chronic obstructive pulmonary disease, still smoking, and she is down to half a pack of cigarettes a day.  5. History of tubal ligation.  6. History of cholecystectomy.  7. Pilonidal cyst surgery x3, the last in 1983.  8. Reflux symptomatology on Nexium.  9. Acute anxiety secondary to the breast cancer.  10. Bilateral reduction mammoplasties by Dr. Shon Hough in the 1990s     PLAN:  1. Biotene oral balance or mouthspray as directed 2. Continue good oral hygiene. 3. Try ice chips for oral dryness 4. Try hard candy for oral dryness 5. Aleve 4 x per day for LE pain. 6. Lortab every 8 hours in addition to Aleve if #5 alone is ineffective. 7. Continue with Nexium BID  8. Mylanta as needed 9. Continue with chemotherapy as scheduled. 10. Recommended a therapeutic cry as needed.  Sometimes this is the best therapy.  11.  Recommended Ativan as needed every 8 hours for anxiety. 12. Left a message with the patient later this evening to call the clinic if her mouth continues to bother her.  I did not appreciate any thrush on exam, but may want to consider [facility] Mouthwash 13. Return as scheduled for follow-up and chemotherapy.    All questions were answered. The patient knows to call the clinic with any problems, questions or concerns. We can certainly see the patient much sooner if necessary.  I spent 30 minutes counseling the patient face to face. The total time spent in the appointment was 40 minutes.  More than 50% of the time spent with the patient was utilized for counseling and coordination of care.   Cordelia Bessinger

## 2011-05-04 ENCOUNTER — Telehealth (HOSPITAL_COMMUNITY): Payer: Self-pay | Admitting: *Deleted

## 2011-05-04 ENCOUNTER — Other Ambulatory Visit (HOSPITAL_COMMUNITY): Payer: Self-pay | Admitting: Oncology

## 2011-05-04 DIAGNOSIS — C50419 Malignant neoplasm of upper-outer quadrant of unspecified female breast: Secondary | ICD-10-CM

## 2011-05-04 MED ORDER — FIRST-DUKES MOUTHWASH MT SUSP: 5.0000 mL | Freq: Four times a day (QID) | OROMUCOSAL | Status: DC | PRN

## 2011-05-04 NOTE — Telephone Encounter (Signed)
Called to report that tongue still feels like it is "coated" , mouth is dry and she is using biotene spray, mouthwash and gum. Call her with any other instrudtions at [phone]

## 2011-05-10 ENCOUNTER — Encounter (HOSPITAL_COMMUNITY): Payer: BC Managed Care – PPO | Attending: Oncology

## 2011-05-10 ENCOUNTER — Encounter (HOSPITAL_COMMUNITY): Payer: BC Managed Care – PPO

## 2011-05-10 ENCOUNTER — Encounter (HOSPITAL_COMMUNITY): Payer: BC Managed Care – PPO | Admitting: Oncology

## 2011-05-10 ENCOUNTER — Ambulatory Visit (HOSPITAL_COMMUNITY): Payer: BC Managed Care – PPO | Admitting: Oncology

## 2011-05-10 VITALS — BP 116/81 | HR 104 | Temp 98.4°F | Ht 65.0 in | Wt 194.0 lb

## 2011-05-10 DIAGNOSIS — C50419 Malignant neoplasm of upper-outer quadrant of unspecified female breast: Secondary | ICD-10-CM | POA: Insufficient documentation

## 2011-05-10 LAB — COMPREHENSIVE METABOLIC PANEL
ALT: 25 U/L (ref 0–35)
AST: 13 U/L (ref 0–37)
Alkaline Phosphatase: 81 U/L (ref 39–117)
CO2: 25 mEq/L (ref 19–32)
GFR calc Af Amer: >90 mL/min (ref >90)
Glucose, Bld: 194 mg/dL — ABNORMAL HIGH (ref 70–99)
Potassium: 4.3 mEq/L (ref 3.5–5.1)
Sodium: 137 mEq/L (ref 135–145)
Total Protein: 7.2 g/dL (ref 6.0–8.3)

## 2011-05-10 LAB — DIFFERENTIAL
Eosinophils Absolute: 0 10*3/uL (ref 0.0–0.7)
Lymphocytes Relative: 8 % — ABNORMAL LOW (ref 12–46)
Lymphs Abs: 1.1 10*3/uL (ref 0.7–4.0)
Neutrophils Relative %: 85 % — ABNORMAL HIGH (ref 43–77)

## 2011-05-10 LAB — CBC
Platelets: 193 10*3/uL (ref 150–400)
RBC: 3.92 MIL/uL (ref 3.87–5.11)
WBC: 13.9 10*3/uL — ABNORMAL HIGH (ref 4.0–10.5)

## 2011-05-10 MED ORDER — SODIUM CHLORIDE 0.9 % IV SOLN
Freq: Once | INTRAVENOUS | Status: AC
  Administered 2011-05-10: 8 mg via INTRAVENOUS
  Filled 2011-05-10: qty 4

## 2011-05-10 MED ORDER — SODIUM CHLORIDE 0.9 % IV SOLN
Freq: Once | INTRAVENOUS | Status: AC
  Administered 2011-05-10: 09:00:00 via INTRAVENOUS

## 2011-05-10 MED ORDER — DOCETAXEL CHEMO INJECTION 160 MG/16ML
75.0000 mg/m2 | Freq: Once | INTRAVENOUS | Status: AC
  Administered 2011-05-10: 150 mg via INTRAVENOUS
  Filled 2011-05-10: qty 15

## 2011-05-10 MED ORDER — HEPARIN SOD (PORK) LOCK FLUSH 100 UNIT/ML IV SOLN
500.0000 [IU] | Freq: Once | INTRAVENOUS | Status: AC | PRN
  Administered 2011-05-10: 500 [IU]
  Filled 2011-05-10: qty 5

## 2011-05-10 MED ORDER — SODIUM CHLORIDE 0.9 % IV SOLN: 8.0000 mg | Freq: Once | INTRAVENOUS | Status: DC

## 2011-05-10 MED ORDER — DEXAMETHASONE SODIUM PHOSPHATE 10 MG/ML IJ SOLN: 10.0000 mg | Freq: Once | INTRAMUSCULAR | Status: DC

## 2011-05-10 MED ORDER — SODIUM CHLORIDE 0.9 % IJ SOLN
10.0000 mL | INTRAMUSCULAR | Status: DC | PRN
  Administered 2011-05-10: 10 mL
  Filled 2011-05-10: qty 10

## 2011-05-10 NOTE — Progress Notes (Signed)
Tolerated well

## 2011-05-11 ENCOUNTER — Encounter (HOSPITAL_BASED_OUTPATIENT_CLINIC_OR_DEPARTMENT_OTHER): Payer: BC Managed Care – PPO

## 2011-05-11 VITALS — BP 124/81 | HR 99

## 2011-05-11 DIAGNOSIS — Z5189 Encounter for other specified aftercare: Secondary | ICD-10-CM

## 2011-05-11 DIAGNOSIS — C50419 Malignant neoplasm of upper-outer quadrant of unspecified female breast: Secondary | ICD-10-CM

## 2011-05-11 MED ORDER — PEGFILGRASTIM INJECTION 6 MG/0.6ML
SUBCUTANEOUS | Status: AC
  Filled 2011-05-11: qty 0.6

## 2011-05-11 MED ORDER — PEGFILGRASTIM INJECTION 6 MG/0.6ML
6.0000 mg | Freq: Once | SUBCUTANEOUS | Status: AC
  Administered 2011-05-11: 6 mg via SUBCUTANEOUS

## 2011-05-11 NOTE — Progress Notes (Signed)
Tolerated injection well. 

## 2011-05-16 ENCOUNTER — Encounter (HOSPITAL_COMMUNITY): Payer: Self-pay

## 2011-05-16 ENCOUNTER — Telehealth (HOSPITAL_COMMUNITY): Payer: Self-pay

## 2011-05-16 ENCOUNTER — Other Ambulatory Visit (HOSPITAL_COMMUNITY): Payer: Self-pay | Admitting: Oncology

## 2011-05-16 DIAGNOSIS — G629 Polyneuropathy, unspecified: Secondary | ICD-10-CM

## 2011-05-16 MED ORDER — GABAPENTIN 300 MG PO CAPS: ORAL_CAPSULE | ORAL | Status: DC

## 2011-05-16 NOTE — Telephone Encounter (Signed)
Dellis Anes, PA 05/16/2011 9:56 AM Signed  Is it better or getting better? Would she like to try another pain medication? Let me know. Also, I agree with doing her last 2 cycles every 3 weeks. I will make adjustments on her treatment plan. Evelena Leyden, RN 05/16/2011 9:15 AM Signed  Call from patient with complaints of " numbness and tingling in feet which started on Saturday and was worse on Sunday. Very difficult to even walk or move. I'm have a lot of pain in my legs and the aleve and lortabs did not help. I started taking them before chemotherapy and it did not make any difference. Tom told me we could move my chemotherapy to every 3 weeks instead of every 2 weeks for my last 2 treatments and I would like to do that. My daughter graduates in 2 weeks and if  I'm treated in 2 weeks, I won't be able to go because I can't walk. Also, every time I blow my nose, I see some blood but think that is due to my allergies". Encouraged to use saline nasal spray to help with the dryness and that I would speak with the PA about the leg pain and numbness and tingling.  05/16/11 1550 - per patient leg pain is getting better but the feeling of pins and needles in her feet is what the biggest issue is now.  Uses [facility] in [place] if meds are prescribed. Tobie Lords, RN

## 2011-05-16 NOTE — Telephone Encounter (Signed)
Is it better or getting better?  Would she like to try another pain medication?  Let me know.  Also, I agree with doing her last 2 cycles every 3 weeks. I will make adjustments on her treatment plan.

## 2011-05-16 NOTE — Telephone Encounter (Signed)
Call from patient with complaints of " numbness and tingling in feet which started on Saturday and was worse on Sunday.  Very difficult to even walk or move.  I'm have a lot of pain in my legs and the aleve and lortabs did not help.  I started taking them before chemotherapy and it did not make any difference.  Tom told me we could move my chemotherapy to every 3 weeks instead of every 2 weeks for my last 2 treatments and I would like to do that.  My daughter graduates in 2 weeks and if I'm treated in 2 weeks, I won't be able to go because I can't walk.  Also, every time I blow my nose, I see some blood but think that is due to my allergies".  Encouraged to use saline nasal spray to help with the dryness and that I would speak with the PA about the leg pain and numbness and tingling.

## 2011-05-16 NOTE — Telephone Encounter (Signed)
Patient notified that prescription for gabapentin e-scribed to her pharmacy and instructed how to take it.  Chemo schedule changes also discussed with patient.  Patient verbalized understanding.

## 2011-05-17 ENCOUNTER — Encounter (HOSPITAL_COMMUNITY): Payer: BC Managed Care – PPO

## 2011-05-23 ENCOUNTER — Telehealth (HOSPITAL_COMMUNITY): Payer: Self-pay | Admitting: *Deleted

## 2011-05-23 NOTE — Telephone Encounter (Signed)
Message copied by Dennie Maizes on Mon May 23, 2011  3:15 PM ------      Message from: Ellouise Newer III      Created: Mon May 23, 2011  2:21 PM       Should not be chemotherapy related.  She should use neosporin, polysporin, or something like that on blisters.  She also needs an appointment the day of chemotherapy coming.  If these get worse, please call us.  Provide her education regarding infections.                   ----- Message -----         From: Valentina Shaggy Natasa Stigall, RN         Sent: 05/23/2011   9:52 AM           To: Ellouise Newer, PA            Pt left message that she was having some "blisters" pop up between her thumb and index finger. She said they didn't hurt but she didn't know if it was something from chemotherapy or not and if she should be doing anything about it. #[phone]

## 2011-05-23 NOTE — Telephone Encounter (Signed)
Spoke with pt as below. Verbalized understanding. 

## 2011-05-24 ENCOUNTER — Encounter (HOSPITAL_COMMUNITY): Payer: BC Managed Care – PPO | Admitting: Oncology

## 2011-05-24 ENCOUNTER — Encounter (HOSPITAL_COMMUNITY): Payer: BC Managed Care – PPO

## 2011-05-24 ENCOUNTER — Other Ambulatory Visit (HOSPITAL_COMMUNITY): Payer: BC Managed Care – PPO

## 2011-05-25 ENCOUNTER — Encounter (HOSPITAL_COMMUNITY): Payer: BC Managed Care – PPO

## 2011-05-31 ENCOUNTER — Encounter (HOSPITAL_BASED_OUTPATIENT_CLINIC_OR_DEPARTMENT_OTHER): Payer: BC Managed Care – PPO

## 2011-05-31 ENCOUNTER — Other Ambulatory Visit (HOSPITAL_COMMUNITY): Payer: BC Managed Care – PPO

## 2011-05-31 ENCOUNTER — Encounter (HOSPITAL_COMMUNITY): Payer: BC Managed Care – PPO

## 2011-05-31 VITALS — BP 128/63 | HR 109 | Temp 97.0°F | Wt 200.2 lb

## 2011-05-31 DIAGNOSIS — Z5111 Encounter for antineoplastic chemotherapy: Secondary | ICD-10-CM

## 2011-05-31 DIAGNOSIS — C50419 Malignant neoplasm of upper-outer quadrant of unspecified female breast: Secondary | ICD-10-CM

## 2011-05-31 LAB — DIFFERENTIAL
Eosinophils Absolute: 0 10*3/uL (ref 0.0–0.7)
Lymphocytes Relative: 6 % — ABNORMAL LOW (ref 12–46)
Lymphs Abs: 0.8 10*3/uL (ref 0.7–4.0)
Neutro Abs: 11.5 10*3/uL — ABNORMAL HIGH (ref 1.7–7.7)
Neutrophils Relative %: 88 % — ABNORMAL HIGH (ref 43–77)

## 2011-05-31 LAB — BASIC METABOLIC PANEL
Chloride: 101 mEq/L (ref 96–112)
GFR calc Af Amer: >90 mL/min (ref >90)
GFR calc non Af Amer: >90 mL/min (ref >90)
Glucose, Bld: 179 mg/dL — ABNORMAL HIGH (ref 70–99)
Potassium: 4.2 mEq/L (ref 3.5–5.1)
Sodium: 138 mEq/L (ref 135–145)

## 2011-05-31 LAB — CBC
Hemoglobin: 11.8 g/dL — ABNORMAL LOW (ref 12.0–15.0)
MCH: 32.5 pg (ref 26.0–34.0)
Platelets: 331 10*3/uL (ref 150–400)
RBC: 3.63 MIL/uL — ABNORMAL LOW (ref 3.87–5.11)
WBC: 13.1 10*3/uL — ABNORMAL HIGH (ref 4.0–10.5)

## 2011-05-31 MED ORDER — SODIUM CHLORIDE 0.9 % IV SOLN
Freq: Once | INTRAVENOUS | Status: AC
  Administered 2011-05-31: 09:00:00 via INTRAVENOUS

## 2011-05-31 MED ORDER — HEPARIN SOD (PORK) LOCK FLUSH 100 UNIT/ML IV SOLN
INTRAVENOUS | Status: AC
  Filled 2011-05-31: qty 5

## 2011-05-31 MED ORDER — DOCETAXEL CHEMO INJECTION 160 MG/16ML
75.0000 mg/m2 | Freq: Once | INTRAVENOUS | Status: AC
  Administered 2011-05-31: 150 mg via INTRAVENOUS
  Filled 2011-05-31: qty 15

## 2011-05-31 MED ORDER — DEXAMETHASONE SODIUM PHOSPHATE 10 MG/ML IJ SOLN: 10.0000 mg | Freq: Once | INTRAMUSCULAR | Status: DC

## 2011-05-31 MED ORDER — SODIUM CHLORIDE 0.9 % IV SOLN
Freq: Once | INTRAVENOUS | Status: AC
  Administered 2011-05-31: 8 mg via INTRAVENOUS
  Filled 2011-05-31: qty 4

## 2011-05-31 MED ORDER — SODIUM CHLORIDE 0.9 % IV SOLN: 8.0000 mg | Freq: Once | INTRAVENOUS | Status: DC

## 2011-05-31 MED ORDER — HEPARIN SOD (PORK) LOCK FLUSH 100 UNIT/ML IV SOLN
500.0000 [IU] | Freq: Once | INTRAVENOUS | Status: AC | PRN
  Administered 2011-05-31: 500 [IU]
  Filled 2011-05-31: qty 5

## 2011-06-01 ENCOUNTER — Encounter (HOSPITAL_BASED_OUTPATIENT_CLINIC_OR_DEPARTMENT_OTHER): Payer: BC Managed Care – PPO

## 2011-06-01 VITALS — BP 119/77 | HR 89 | Temp 98.3°F

## 2011-06-01 DIAGNOSIS — Z5189 Encounter for other specified aftercare: Secondary | ICD-10-CM

## 2011-06-01 DIAGNOSIS — C50419 Malignant neoplasm of upper-outer quadrant of unspecified female breast: Secondary | ICD-10-CM

## 2011-06-01 MED ORDER — PEGFILGRASTIM INJECTION 6 MG/0.6ML
6.0000 mg | Freq: Once | SUBCUTANEOUS | Status: AC
  Administered 2011-06-01: 6 mg via SUBCUTANEOUS

## 2011-06-01 MED ORDER — PEGFILGRASTIM INJECTION 6 MG/0.6ML
SUBCUTANEOUS | Status: AC
  Filled 2011-06-01: qty 0.6

## 2011-06-01 NOTE — Progress Notes (Signed)
Sheila Jefferson presents today for injection per MD orders. Neulasta 6mg administered SQ in left Abdomen. Administration without incident. Patient tolerated well.  

## 2011-06-07 ENCOUNTER — Encounter (HOSPITAL_COMMUNITY): Payer: BC Managed Care – PPO

## 2011-06-07 ENCOUNTER — Ambulatory Visit (HOSPITAL_COMMUNITY): Payer: BC Managed Care – PPO

## 2011-06-07 ENCOUNTER — Other Ambulatory Visit (HOSPITAL_COMMUNITY): Payer: BC Managed Care – PPO

## 2011-06-08 ENCOUNTER — Encounter (HOSPITAL_COMMUNITY): Payer: BC Managed Care – PPO

## 2011-06-14 ENCOUNTER — Encounter (HOSPITAL_COMMUNITY): Payer: BC Managed Care – PPO

## 2011-06-15 ENCOUNTER — Other Ambulatory Visit (HOSPITAL_COMMUNITY): Payer: Self-pay | Admitting: Oncology

## 2011-06-15 DIAGNOSIS — C50419 Malignant neoplasm of upper-outer quadrant of unspecified female breast: Secondary | ICD-10-CM

## 2011-06-15 MED ORDER — TOBRAMYCIN-DEXAMETHASONE 0.3-0.1 % OP SUSP: 1.0000 [drp] | OPHTHALMIC | Status: AC

## 2011-06-20 ENCOUNTER — Encounter (HOSPITAL_COMMUNITY): Payer: BC Managed Care – PPO | Attending: Oncology | Admitting: Oncology

## 2011-06-20 VITALS — BP 125/83 | HR 112 | Wt 203.8 lb

## 2011-06-20 DIAGNOSIS — G620 Drug-induced polyneuropathy: Secondary | ICD-10-CM

## 2011-06-20 DIAGNOSIS — Z170 Estrogen receptor positive status [ER+]: Secondary | ICD-10-CM

## 2011-06-20 DIAGNOSIS — C50419 Malignant neoplasm of upper-outer quadrant of unspecified female breast: Secondary | ICD-10-CM | POA: Insufficient documentation

## 2011-06-20 LAB — BASIC METABOLIC PANEL
CO2: 25 mEq/L (ref 19–32)
Chloride: 101 mEq/L (ref 96–112)
Creatinine, Ser: 0.6 mg/dL (ref 0.50–1.10)
GFR calc Af Amer: >90 mL/min (ref >90)
Potassium: 4.4 mEq/L (ref 3.5–5.1)
Sodium: 137 mEq/L (ref 135–145)

## 2011-06-20 LAB — DIFFERENTIAL
Basophils Absolute: 0 10*3/uL (ref 0.0–0.1)
Basophils Relative: 0 % (ref 0–1)
Lymphocytes Relative: 6 % — ABNORMAL LOW (ref 12–46)
Neutro Abs: 8.1 10*3/uL — ABNORMAL HIGH (ref 1.7–7.7)
Neutrophils Relative %: 92 % — ABNORMAL HIGH (ref 43–77)

## 2011-06-20 LAB — CBC
HCT: 41.1 % (ref 36.0–46.0)
MCHC: 32.1 g/dL (ref 30.0–36.0)
Platelets: 367 10*3/uL (ref 150–400)
RDW: 16.7 % — ABNORMAL HIGH (ref 11.5–15.5)
WBC: 8.9 10*3/uL (ref 4.0–10.5)

## 2011-06-20 NOTE — Progress Notes (Signed)
CC:   Sheila Flavin, MD Sheila Jefferson, M.D. Sheila Jefferson, M.D. Dr Mora Appl  DIAGNOSES: 1. Stage II, grade 2 infiltrating ductal carcinoma of the right     breast, 2.5 cm in size with 1/15 positive nodes and an additional     highest right axillary node that is negative giving her a total of     16 nodes.  Her tumor was HER-2/neu negative and her ER receptors     were positive at 90%, PR positive 80%.  Ki-67 marker was high at     71% and again HER-2/neu was negative.  She is status post     chemotherapy initially with 4 cycles dose dense Adriamycin and     Cytoxan.  She is about to take her last cycle of Taxotere 75 mg per     meter squared tomorrow and that will be her 4th cycle but it is     every 3 weeks. 2. Peripheral neuropathy secondary to chemotherapy on Neurontin with     nice improvement in symptomatology in her feet. 3. History of a bulging lumbar disk in the past. 4. History of gastroesophageal reflux disease. 5. History of chronic "hormonal" imbalance.  When I first met Sheila Jefferson she was on 200 mg of Aldactone for leg swelling, body fluid retention.  She is now on 100 mg a day, seems to have worsening of this swelling she states.  There is no pitting edema of her ankles or legs but she feels very bloated both in her thorax and abdominal areas as well as in her legs.  She also has Taxotere induced skin irritation of her feet; they are peeling but there is no blistering she states that has fluid in it, she just states that they start to peel.  They are not tender to palpation.  They are mildly red on her feet and she also has mildly palmar erythema as well but she states she has had that before.  Her breast exam is negative today.  Vital signs are stable.  Lymph nodes are negative throughout.  Lungs are clear.  Heart shows a regular rhythm and rate.  Port-A-Cath is intact.  Abdomen shows no hepatosplenomegaly. Bowel sounds are diminished but present.  Again, there is no  pitting arm or leg edema.  She is going to finish the Taxotere tomorrow and we will get her back to Dr. Michell Heinrich.  She will then set her up for simulation and therapy and then we will initiate tamoxifen therapy in about 10 weeks when I see her back.  We went over some of the side effects of tamoxifen today especially uterine cancer since she has been told she has a uterine polyp and I told her to follow up with her doctor in [place] who is Dr. Mora Appl.  She wanted to know if she needs a hysterectomy, oophorectomy, etc. I told her I am not sure she really needs to do that.  So we will see her back in 10 weeks, will finish chemotherapy tomorrow.  Her counts are pending from today.  I am looking at them now but they are excellent at this time so she will be ready to go.  I am going to also put her back on 3 of the Neurontin at night.  She was only on 1 and that was due to decrease in her dose by my PA Dellis Anes, who was concerned the swelling in her legs and tightness might be from that but I do  not think it is and it has really helped her sleep at night, and her neuropathy discomfort is almost gone in that regards.  I told her she may need to be on the Neurontin for 6-12 months potentially or even more until her nerves heal.  She also has a back issue and will see Dr. Trey Sailors about that who she has seen in the past. She supposedly has a disk that may need to be operated on but I have asked her to wait until at least after the radiation therapy and I think the Neurontin is helping her with that discomfort for certain as well. It goes down her left buttocks and left lower leg.  So I will see her back in 10 weeks.  We will go over the tamoxifen side effects but that is which she should have, to take for 5 years perhaps 10 since she is a young woman with the recent data that is out from the [facility].  Will at least start her out on that and go over more of  the side effects then but I told she does not need a hysterectomy from my standpoint, that she can discuss other issues with Dr. Mora Appl when she sees him and I have asked her to see him in July.    ______________________________ Sheila Jefferson. Mariel Sleet, MD ESN/MEDQ  D:  06/20/2011  T:  06/20/2011  Job:  [id number]

## 2011-06-20 NOTE — Patient Instructions (Addendum)
[facility]  Discharge Instructions Sheila Jefferson  [id number] [birth year] Dr. Glenford Peers    RECOMMENDATIONS MADE BY THE CONSULTANT AND ANY TEST RESULTS WILL BE SENT TO YOUR REFERRING DOCTOR.   EXAM FINDINGS BY MD TODAY AND SIGNS AND SYMPTOMS TO REPORT TO CLINIC OR PRIMARY MD:  Go back to taking Neurontin 3 tablets at bedtime as before. Once the numbness, tingling, burning stops or decreases you can start coming off of your Neurontin.   Take Aldactone 200mg  for the rest of this week  Return in 10 weeks to see Dr. Mariel Sleet  We will set up an appt with Dr. Michell Heinrich for radiation markings in [place]. Expect a phone call from them.  Return to your primary care doctor for follow up  You will not start the hormone pill until after radiation and seeing Dr. Mariel Sleet  Stay out of the sun. You do not want melanoma.   Don't get a pap smear until September or so because you could get a false positive reading   In July, we will get a genetic counselor consultation regarding your risk level of getting ovarian cancer.   Return to the lymphedema lady regarding the swelling in the rt hand I acknowledge that I have been informed and understand all the instructions given to me and received a copy. I do not have any more questions at this time, but understand that I may call the Specialty Clinic at [facility] at [phone] during business hours should I have any further questions or need assistance in obtaining follow-up care.    __________________________________________  _____________  __________ Signature of Patient or Authorized Representative            Date                   Time    __________________________________________ Nurse's Signature

## 2011-06-20 NOTE — Progress Notes (Signed)
This office note has been dictated.

## 2011-06-21 ENCOUNTER — Other Ambulatory Visit (HOSPITAL_COMMUNITY): Payer: BC Managed Care – PPO

## 2011-06-21 ENCOUNTER — Encounter (HOSPITAL_BASED_OUTPATIENT_CLINIC_OR_DEPARTMENT_OTHER): Payer: BC Managed Care – PPO

## 2011-06-21 VITALS — BP 140/75 | HR 84 | Temp 97.5°F | Wt 202.7 lb

## 2011-06-21 DIAGNOSIS — Z5111 Encounter for antineoplastic chemotherapy: Secondary | ICD-10-CM

## 2011-06-21 DIAGNOSIS — C773 Secondary and unspecified malignant neoplasm of axilla and upper limb lymph nodes: Secondary | ICD-10-CM

## 2011-06-21 DIAGNOSIS — C50419 Malignant neoplasm of upper-outer quadrant of unspecified female breast: Secondary | ICD-10-CM

## 2011-06-21 MED ORDER — HEPARIN SOD (PORK) LOCK FLUSH 100 UNIT/ML IV SOLN
500.0000 [IU] | Freq: Once | INTRAVENOUS | Status: AC | PRN
  Administered 2011-06-21: 500 [IU]
  Filled 2011-06-21: qty 5

## 2011-06-21 MED ORDER — SODIUM CHLORIDE 0.9 % IJ SOLN
INTRAMUSCULAR | Status: AC
  Filled 2011-06-21: qty 10

## 2011-06-21 MED ORDER — SODIUM CHLORIDE 0.9 % IV SOLN: 8.0000 mg | Freq: Once | INTRAVENOUS | Status: DC

## 2011-06-21 MED ORDER — DOCETAXEL CHEMO INJECTION 160 MG/16ML
75.0000 mg/m2 | Freq: Once | INTRAVENOUS | Status: AC
  Administered 2011-06-21: 150 mg via INTRAVENOUS
  Filled 2011-06-21: qty 15

## 2011-06-21 MED ORDER — SODIUM CHLORIDE 0.9 % IV SOLN
Freq: Once | INTRAVENOUS | Status: AC
  Administered 2011-06-21: 10:00:00 via INTRAVENOUS

## 2011-06-21 MED ORDER — HEPARIN SOD (PORK) LOCK FLUSH 100 UNIT/ML IV SOLN
INTRAVENOUS | Status: AC
  Filled 2011-06-21: qty 5

## 2011-06-21 MED ORDER — SODIUM CHLORIDE 0.9 % IV SOLN
Freq: Once | INTRAVENOUS | Status: AC
  Administered 2011-06-21: 8 mg via INTRAVENOUS
  Filled 2011-06-21: qty 4

## 2011-06-21 MED ORDER — DEXAMETHASONE SODIUM PHOSPHATE 10 MG/ML IJ SOLN: 10.0000 mg | Freq: Once | INTRAMUSCULAR | Status: DC

## 2011-06-21 MED ORDER — SODIUM CHLORIDE 0.9 % IJ SOLN
10.0000 mL | INTRAMUSCULAR | Status: DC | PRN
  Administered 2011-06-21: 10 mL
  Filled 2011-06-21: qty 10

## 2011-06-22 ENCOUNTER — Encounter (HOSPITAL_BASED_OUTPATIENT_CLINIC_OR_DEPARTMENT_OTHER): Payer: BC Managed Care – PPO

## 2011-06-22 ENCOUNTER — Telehealth: Payer: Self-pay | Admitting: *Deleted

## 2011-06-22 VITALS — BP 113/76 | HR 91

## 2011-06-22 DIAGNOSIS — C50419 Malignant neoplasm of upper-outer quadrant of unspecified female breast: Secondary | ICD-10-CM

## 2011-06-22 DIAGNOSIS — Z5189 Encounter for other specified aftercare: Secondary | ICD-10-CM

## 2011-06-22 MED ORDER — PEGFILGRASTIM INJECTION 6 MG/0.6ML
6.0000 mg | Freq: Once | SUBCUTANEOUS | Status: AC
  Administered 2011-06-22: 6 mg via SUBCUTANEOUS

## 2011-06-22 MED ORDER — PEGFILGRASTIM INJECTION 6 MG/0.6ML
SUBCUTANEOUS | Status: AC
  Filled 2011-06-22: qty 0.6

## 2011-06-22 NOTE — Telephone Encounter (Signed)
Left message for pt to return my call so I can schedule her a genetic appt.

## 2011-06-22 NOTE — Progress Notes (Signed)
Tolerated injection well. 

## 2011-06-29 ENCOUNTER — Telehealth: Payer: Self-pay | Admitting: *Deleted

## 2011-06-29 NOTE — Telephone Encounter (Signed)
Confirmed 08/04/11 genetic appt w/ pt.  LMOVM for [place] at [facility] to make her aware.

## 2011-07-11 ENCOUNTER — Telehealth (HOSPITAL_COMMUNITY): Payer: Self-pay | Admitting: *Deleted

## 2011-07-11 NOTE — Telephone Encounter (Signed)
Pt wants to know how long she can expect the "eyes watering" to last after chemo finished. She is 3 weeks out from last treatment

## 2011-07-11 NOTE — Telephone Encounter (Signed)
Potentially another 3 weeks

## 2011-07-22 ENCOUNTER — Telehealth (HOSPITAL_COMMUNITY): Payer: Self-pay

## 2011-07-22 NOTE — Telephone Encounter (Signed)
Call from [facility].  Stated "I saw Dr. Michell Heinrich at Radiation and she saw the swelling that I had in my arm with the sleeve and glove on and she also saw how swollen my feet and legs were and I told her that I was not passing much urine.  I told her that I had decreased my gabapentin to 600 mg for a short time and that I began to pass more urine but my bones began to hurt a lot and my feet started tingling again so I went back up to 900 mg and I'm retaining fluid again.  She wanted me to talk with Dr. Mariel Sleet to see what I needed to do.  I have radiation at 10 am every day but could come in to be seen anytime before or after that.  If Dr. Mariel Sleet wants to see me I want to have a little notice so I can put the sleeve and glove on so he can see if I need to be refitted."  Patient can be reached @ [phone].

## 2011-07-25 ENCOUNTER — Telehealth (HOSPITAL_COMMUNITY): Payer: Self-pay

## 2011-07-25 DIAGNOSIS — C50419 Malignant neoplasm of upper-outer quadrant of unspecified female breast: Secondary | ICD-10-CM

## 2011-07-25 DIAGNOSIS — R609 Edema, unspecified: Secondary | ICD-10-CM

## 2011-07-25 NOTE — Telephone Encounter (Signed)
Patient will come in tomorrow for [place] to check the swelling in her arms and legs.  Placed on PA's schedule.

## 2011-07-25 NOTE — Telephone Encounter (Signed)
Message copied by Evelena Leyden on Mon Jul 25, 2011  4:09 PM ------      Message from: Mariel Sleet, ERIC S      Created: Mon Jul 25, 2011 12:27 PM       When can she come? Will need cbc and diff and bmet

## 2011-07-26 ENCOUNTER — Encounter (HOSPITAL_COMMUNITY): Payer: BC Managed Care – PPO | Attending: Oncology | Admitting: Oncology

## 2011-07-26 ENCOUNTER — Encounter (HOSPITAL_COMMUNITY): Payer: BC Managed Care – PPO

## 2011-07-26 VITALS — BP 119/74 | HR 110 | Temp 98.3°F | Wt 200.0 lb

## 2011-07-26 DIAGNOSIS — R609 Edema, unspecified: Secondary | ICD-10-CM

## 2011-07-26 DIAGNOSIS — C50419 Malignant neoplasm of upper-outer quadrant of unspecified female breast: Secondary | ICD-10-CM

## 2011-07-26 DIAGNOSIS — I890 Lymphedema, not elsewhere classified: Secondary | ICD-10-CM

## 2011-07-26 LAB — CBC
HCT: 43.8 % (ref 36.0–46.0)
Hemoglobin: 14.4 g/dL (ref 12.0–15.0)
MCH: 32.4 pg (ref 26.0–34.0)
MCHC: 32.9 g/dL (ref 30.0–36.0)
MCV: 98.4 fL (ref 78.0–100.0)

## 2011-07-26 LAB — BASIC METABOLIC PANEL
BUN: 8 mg/dL (ref 6–23)
CO2: 26 mEq/L (ref 19–32)
GFR calc non Af Amer: >90 mL/min (ref >90)
Glucose, Bld: 95 mg/dL (ref 70–99)
Potassium: 4.2 mEq/L (ref 3.5–5.1)

## 2011-07-26 LAB — DIFFERENTIAL
Basophils Relative: 1 % (ref 0–1)
Eosinophils Absolute: 0.2 10*3/uL (ref 0.0–0.7)
Eosinophils Relative: 4 % (ref 0–5)
Monocytes Absolute: 0.6 10*3/uL (ref 0.1–1.0)
Monocytes Relative: 10 % (ref 3–12)
Neutrophils Relative %: 68 % (ref 43–77)

## 2011-07-26 NOTE — Progress Notes (Signed)
Patient is seen as a work-in today for swelling.  She has right UE edema, lymphedema.  She is utilizing her compression sleeve and compression glove for this however she probably needs refitted. Her initial fitting was before she started having lymphedema. She reports that the distal portion of the sleeve causes a compressed line circumferentially around her wrist/distal forearm. In addition to this uncomfortable circumferential depression/compression her hand becomes more edematous likely because of decreased venous and lymphatic return.  On physical exam, she has 2+ pitting edema of the right hand. She potentially has trace pitting edema of the right forearm. Her left upper extremity is not appreciated the edematous.  She also reports lower extremity edema and this is appreciated to be trace to 1+ pitting pedal edema.  She associates this with increased gabapentin usage at 900 mg at hour of sleep. She reports that she is getting surprising remarks from other people when she elaborates on the dose of gabapentin she is taking despite this being a low dose. These reactions concern her because gabapentin has a listed of side effect of fluid retention and she is fearful that she is on a high dose of gabapentin.  I rpovided her education regarding gabapentin and I do not think her fluid retention is from the medication.  She is at a low dose of gabapentin (maximum dose of 3600 mg daily).  In the past, Tempestt was on spironolactone 100 mg twice a day. She slowly decreased to 100 mg daily. Most recently, she ran out of this prescription and has discontinued the medication.  She is very concerned with her fluid retention and she is worried that it is secondary to the gabapentin. Therefore, she has decreased her gabapentin dosage from 900 milligrams to 600 mg at hour of sleep. She thinks this has helped her fluid retention.  She reports that her peripheral neuropathy is much improved and is localized to the fingertips  and the tips of the toes.    Firstly, I informed the patient that I do not think her fluid retention is secondary to the low dose gabapentin. I've given her liberty to take anywhere between 900 mg of gabapentin to 300 mg gabapentin at hour of sleep particularly in light of her improving peripheral neuropathy.  We will eventually start decreasing her dose in the future so it can be discontinued.    Secondly, we will reinitiate spironolactone. She will take 200 mg daily for 2 days (today and tomorrow) and then she will get on a regimen of alternating 100 mg daily with 200 mg daily. We'll do this for a short time until her edema has improved/resolved.  Finally, the patient is pursuing a physical therapy consultation in [place] for the lymphedema sleeve. She will probably need refitted for the sleeve.  The patient will call us Thursday or Friday to let us know how she is doing in regards to her edema. She reports that her sister has given her 20 mg of Lasix pills but she has not taken any. She will be traveling to [place], [place] this coming Thursday and will remain there through Sunday. She will contact us on Thursday or Friday to let us she is doing. She will take her Lasix prescription with her on this trip. I provided patient with education regarding spironolactone. She understands it is a potassium sparing diuretic. Lasix, on the other hand is not potassium sparing.  The patient will keep in contact with [place] regarding her edema. I think she  will require a 2 pronged approach including a diuretic and refitting of her lymphedema sleeve.  Hopefully the aforementioned spironolactone diuretic regimen will be effective for her. Of note, chart review was performed and 2-D echocardiogram on 04/12/2011 revealed an ejection fraction of 60-65% without any abnormalities of the left ventricle. She will continue with radiation as scheduled. She will return as scheduled for followup. The patient knows that  we can see her much sooner if needed.  All questions were answered. The patient knows to call the clinic with any problems, questions, or concerns.  Patient and plan discussed with Dr. Glenford Peers and he is in agreement with the aforementioned.  KEFALAS,THOMAS

## 2011-07-26 NOTE — Patient Instructions (Signed)
[facility]  Discharge Instructions Sheila Jefferson  [id number] 1965-06-13 Dr. Glenford Peers  RECOMMENDATIONS MADE BY THE CONSULTANT AND ANY TEST RESULTS WILL BE SENT TO YOUR REFERRING DOCTOR.   EXAM FINDINGS BY MD TODAY AND SIGNS AND SYMPTOMS TO REPORT TO CLINIC OR PRIMARY MD:   MEDICATIONS PRESCRIBED: spironolactone 200 mg today, 200 mg tomorrow then 100/200/100/200   INSTRUCTIONS GIVEN AND DISCUSSED:Call us Friday  SPECIAL INSTRUCTIONS/FOLLOW-UP: As scheduled I acknowledge that I have been informed and understand all the instructions given to me and received a copy. I do not have any more questions at this time, but understand that I may call the Specialty Clinic at [facility] at [phone] during business hours should I have any further questions or need assistance in obtaining follow-up care.    __________________________________________  _____________  __________ Signature of Patient or Authorized Representative            Date                   Time    __________________________________________ Nurse's Signature

## 2011-07-26 NOTE — Progress Notes (Signed)
Labs drawn today for cbc/diff,bmp 

## 2011-07-27 ENCOUNTER — Other Ambulatory Visit (HOSPITAL_COMMUNITY): Payer: Self-pay | Admitting: Oncology

## 2011-07-27 ENCOUNTER — Other Ambulatory Visit (HOSPITAL_COMMUNITY): Payer: BC Managed Care – PPO

## 2011-07-27 DIAGNOSIS — C50419 Malignant neoplasm of upper-outer quadrant of unspecified female breast: Secondary | ICD-10-CM

## 2011-08-03 ENCOUNTER — Encounter (HOSPITAL_BASED_OUTPATIENT_CLINIC_OR_DEPARTMENT_OTHER): Payer: BC Managed Care – PPO

## 2011-08-03 ENCOUNTER — Telehealth: Payer: Self-pay | Admitting: *Deleted

## 2011-08-03 DIAGNOSIS — C50419 Malignant neoplasm of upper-outer quadrant of unspecified female breast: Secondary | ICD-10-CM

## 2011-08-03 DIAGNOSIS — Z452 Encounter for adjustment and management of vascular access device: Secondary | ICD-10-CM

## 2011-08-03 MED ORDER — HEPARIN SOD (PORK) LOCK FLUSH 100 UNIT/ML IV SOLN
INTRAVENOUS | Status: AC
  Filled 2011-08-03: qty 5

## 2011-08-03 MED ORDER — SODIUM CHLORIDE 0.9 % IJ SOLN
10.0000 mL | INTRAMUSCULAR | Status: DC | PRN
  Administered 2011-08-03: 10 mL via INTRAVENOUS
  Filled 2011-08-03: qty 10

## 2011-08-03 MED ORDER — HEPARIN SOD (PORK) LOCK FLUSH 100 UNIT/ML IV SOLN
500.0000 [IU] | Freq: Once | INTRAVENOUS | Status: AC
  Administered 2011-08-03: 500 [IU] via INTRAVENOUS
  Filled 2011-08-03: qty 5

## 2011-08-03 MED ORDER — SODIUM CHLORIDE 0.9 % IJ SOLN
INTRAMUSCULAR | Status: AC
  Filled 2011-08-03: qty 10

## 2011-08-03 NOTE — Telephone Encounter (Signed)
Patient called stating that she is out of work and cannot afford to come in right now for this appt.  Cancelled 08/04/11 appt per pts request.   She said that she would call back at a later date if able.

## 2011-08-03 NOTE — Progress Notes (Signed)
Sheila Jefferson presented for Portacath access and flush.  Proper placement of portacath confirmed by CXR.  Portacath located left chest wall accessed with  H 20 needle.  Good blood return present. Portacath flushed with 20ml NS and 500U/63ml Heparin and needle removed intact.  Procedure without incident.  Patient tolerated procedure well.

## 2011-08-04 ENCOUNTER — Other Ambulatory Visit: Payer: BC Managed Care – PPO | Admitting: Lab

## 2011-08-04 ENCOUNTER — Encounter: Payer: BC Managed Care – PPO | Admitting: Genetic Counselor

## 2011-08-26 ENCOUNTER — Telehealth (HOSPITAL_COMMUNITY): Payer: Self-pay | Admitting: *Deleted

## 2011-08-26 NOTE — Telephone Encounter (Signed)
Needs refills on gabapentin

## 2011-08-29 ENCOUNTER — Encounter (HOSPITAL_COMMUNITY): Payer: BC Managed Care – PPO | Attending: Oncology | Admitting: Oncology

## 2011-08-29 ENCOUNTER — Encounter (HOSPITAL_COMMUNITY): Payer: Self-pay | Admitting: Oncology

## 2011-08-29 VITALS — BP 112/73 | HR 106 | Temp 97.9°F | Resp 16 | Wt 197.2 lb

## 2011-08-29 DIAGNOSIS — C773 Secondary and unspecified malignant neoplasm of axilla and upper limb lymph nodes: Secondary | ICD-10-CM

## 2011-08-29 DIAGNOSIS — C50419 Malignant neoplasm of upper-outer quadrant of unspecified female breast: Secondary | ICD-10-CM | POA: Insufficient documentation

## 2011-08-29 DIAGNOSIS — Z170 Estrogen receptor positive status [ER+]: Secondary | ICD-10-CM

## 2011-08-29 DIAGNOSIS — N898 Other specified noninflammatory disorders of vagina: Secondary | ICD-10-CM

## 2011-08-29 MED ORDER — TAMOXIFEN CITRATE 20 MG PO TABS: 20.0000 mg | ORAL_TABLET | Freq: Every day | ORAL | Status: AC

## 2011-08-29 NOTE — Progress Notes (Signed)
Number 1 grade 2, stage II infiltrating ductal carcinoma the right breast 2.5 cm in size with 1 of 15 positive lymph nodes an additional highest right axillary node that was negative giving her a total of 16 lymph nodes. Her cancer was HER-2 nu negative as receptors were 90%, progesterone separate 80%, Ki-67 marker high at 71%. She took adjuvant Adriamycin and Cytoxan x4 cycles followed by Taxotere x4 cycles every 21 days. The Adriamycin and Cytoxan was dose dense. She has just completed radiation therapy this past Friday, 08/26/2011. She has issues now with ongoing vaginal bleeding which has not disappeared with her chemotherapy and may be getting worse so she will see her gynecologist for a complete workup and evaluation. She also needs her eyes evaluated she states for her vision and that will be done prior to starting tamoxifen. She will not start the tamoxifen until her GYN evaluation is completed however since she is concerned she might need a hysterectomy. We will however prescribe the tamoxifen so she will have it. We went in detail over the side effects of the drug. These included an increase in uterine cancer, DVTs, strokes, arterial clots etc. These also included hot flashes, weight gain, increase in appetite, fluid retention, and some of the nice improvement such as some protection of the heart and improvement in bone density at times.  She wanted to proceed with therapy so we called in the tamoxifen she wants to take 20 mg once a day. We will see her in 6 months sooner if need be she will call so that he side effects but she will call to once she has her GYN workup completed. Her exam today was limited to the right breast and chest wall and axillary area where she does have radiation therapy erythema mild swelling etc. She is very tender here.

## 2011-08-29 NOTE — Patient Instructions (Addendum)
Sheila Jefferson  DOB [birth year] CSN [id number]  MRN [id number] Dr. Glenford Peers   [facility]  Discharge Instructions  RECOMMENDATIONS MADE BY THE CONSULTANT AND ANY TEST RESULTS WILL BE SENT TO YOUR REFERRING DOCTOR.   EXAM FINDINGS BY MD TODAY AND SIGNS AND SYMPTOMS TO REPORT TO CLINIC OR PRIMARY MD: you are doing well.  Call us after you see Dr. Francee Piccolo and your eye doctor and let us know when you start the Tamoxifen.  You can get your port out whenever you want.  While you have it , it will need to be flushed every 6 weeks.  MEDICATIONS PRESCRIBED: Tamoxifen 20 mg daily. Follow label directions  INSTRUCTIONS GIVEN AND DISCUSSED: Other : Report any new lumps, bone pain or shortness of breath.  SPECIAL INSTRUCTIONS/FOLLOW-UP: Lab work Needed in February and Return to Clinic for follow-up after labs in February and port flush in 6 weeks.   I acknowledge that I have been informed and understand all the instructions given to me and received a copy. I do not have any more questions at this time, but understand that I may call the Specialty Clinic at [facility] at [phone] during business hours should I have any further questions or need assistance in obtaining follow-up care.    __________________________________________  _____________  __________ Signature of Patient or Authorized Representative            Date                   Time    __________________________________________ Nurse's Signature

## 2011-08-30 ENCOUNTER — Telehealth (HOSPITAL_COMMUNITY): Payer: Self-pay

## 2011-08-30 NOTE — Telephone Encounter (Signed)
Call from [facility], saw Dr. Francee Piccolo and had blood work to check menopausal status, is scheduled to have several cervical polyps removed 09/30/11, to see eye doctor on Thursday.  Will call us when she actually starts the Tamoxifen.

## 2011-09-07 ENCOUNTER — Ambulatory Visit (INDEPENDENT_AMBULATORY_CARE_PROVIDER_SITE_OTHER): Payer: BC Managed Care – PPO | Admitting: Surgery

## 2011-09-14 ENCOUNTER — Encounter (HOSPITAL_COMMUNITY): Payer: BC Managed Care – PPO | Attending: Oncology

## 2011-09-14 DIAGNOSIS — Z95828 Presence of other vascular implants and grafts: Secondary | ICD-10-CM

## 2011-09-14 DIAGNOSIS — Z9889 Other specified postprocedural states: Secondary | ICD-10-CM | POA: Insufficient documentation

## 2011-09-14 MED ORDER — HEPARIN SOD (PORK) LOCK FLUSH 100 UNIT/ML IV SOLN
INTRAVENOUS | Status: AC
  Filled 2011-09-14: qty 5

## 2011-09-14 MED ORDER — HEPARIN SOD (PORK) LOCK FLUSH 100 UNIT/ML IV SOLN
500.0000 [IU] | Freq: Once | INTRAVENOUS | Status: AC
  Administered 2011-09-14: 500 [IU] via INTRAVENOUS
  Filled 2011-09-14: qty 5

## 2011-09-14 MED ORDER — SODIUM CHLORIDE 0.9 % IJ SOLN
10.0000 mL | INTRAMUSCULAR | Status: DC | PRN
  Administered 2011-09-14: 10 mL via INTRAVENOUS
  Filled 2011-09-14: qty 10

## 2011-09-14 NOTE — Progress Notes (Signed)
Sheila Jefferson presented for Portacath access and flush. Proper placement of portacath confirmed by CXR. Portacath located lt chest wall accessed with  H 20 needle. Good blood return present. Portacath flushed with 20ml NS and 500U/83ml Heparin and needle removed intact. Procedure without incident. Patient tolerated procedure well.

## 2011-09-30 HISTORY — PX: UTERINE FIBROID SURGERY: SHX826

## 2011-10-06 ENCOUNTER — Ambulatory Visit (INDEPENDENT_AMBULATORY_CARE_PROVIDER_SITE_OTHER): Payer: BC Managed Care – PPO | Admitting: Surgery

## 2011-10-06 ENCOUNTER — Encounter (INDEPENDENT_AMBULATORY_CARE_PROVIDER_SITE_OTHER): Payer: Self-pay | Admitting: Surgery

## 2011-10-06 VITALS — BP 112/80 | HR 72 | Temp 97.2°F | Resp 16 | Ht 65.0 in | Wt 194.0 lb

## 2011-10-06 DIAGNOSIS — Z853 Personal history of malignant neoplasm of breast: Secondary | ICD-10-CM

## 2011-10-06 NOTE — Progress Notes (Signed)
NAME: Sheila Jefferson       DOB: 1965-08-06           DATE: 10/06/2011       MRN: [id number]   Sheila Jefferson is a 46 y.o.Marland Kitchenfemale who presents for routine followup of her Right breast cancer diagnosed in 2013 and treated with lumpectomy, node dissection, chemotherapy, radiation therapy.. She has no problems or concerns on either side.she has noticed some numbness in the right arm. This then persisted since surgery.  PFSH: She has had no significant changes since the last visit here except as she has some uterine polyps removed a few days ago. Pathology still pending on those.  ROS: There have been no significant changes since the last visit here  EXAM:  VS: BP 112/80  Pulse 72  Temp 97.2 F (36.2 C) (Temporal)  Resp 16  Ht 5\' 5"  (1.651 m)  Wt 194 lb (87.998 kg)  BMI 32.28 kg/m2  General: The patient is alert, oriented, generally healthy appearing, NAD. Mood and affect are normal.  Breasts:  the right breast is recent radiation changes with increased pigmentation and some edema around the nipple real complex. The lobectomy site in a similar site are well-healed there is no evidence of local recurrences or other problems. The tissue there is fairly soft. The left breast is normal to inspection and palpation.  Lymphatics: She has no axillary or supraclavicular adenopathy on either side.  Extremities: Full ROM of the surgical side with no lymphedema noted.   Impression: Doing well, with no evidence of recurrent cancer or new cancer  Plan: Will continue to follow up on an annual basis here.she would like to have her port out so we will schedule her to have this removed under local anesthesia.

## 2011-10-06 NOTE — Patient Instructions (Addendum)
We will schedule you for removal of your Port-A-Cath to be done under local anesthesia as an outpatient.

## 2011-10-19 ENCOUNTER — Encounter (HOSPITAL_COMMUNITY): Payer: BC Managed Care – PPO | Attending: Oncology | Admitting: Oncology

## 2011-10-19 VITALS — BP 113/75 | HR 104 | Temp 97.6°F | Resp 18 | Wt 196.2 lb

## 2011-10-19 DIAGNOSIS — C50419 Malignant neoplasm of upper-outer quadrant of unspecified female breast: Secondary | ICD-10-CM

## 2011-10-19 DIAGNOSIS — Z9889 Other specified postprocedural states: Secondary | ICD-10-CM | POA: Insufficient documentation

## 2011-10-19 DIAGNOSIS — Z95828 Presence of other vascular implants and grafts: Secondary | ICD-10-CM

## 2011-10-19 DIAGNOSIS — C773 Secondary and unspecified malignant neoplasm of axilla and upper limb lymph nodes: Secondary | ICD-10-CM

## 2011-10-19 MED ORDER — HEPARIN SOD (PORK) LOCK FLUSH 100 UNIT/ML IV SOLN
INTRAVENOUS | Status: AC
  Filled 2011-10-19: qty 5

## 2011-10-19 MED ORDER — HEPARIN SOD (PORK) LOCK FLUSH 100 UNIT/ML IV SOLN
500.0000 [IU] | Freq: Once | INTRAVENOUS | Status: AC
  Administered 2011-10-19: 500 [IU] via INTRAVENOUS
  Filled 2011-10-19: qty 5

## 2011-10-19 MED ORDER — SODIUM CHLORIDE 0.9 % IJ SOLN
10.0000 mL | INTRAMUSCULAR | Status: DC | PRN
  Administered 2011-10-19: 10 mL via INTRAVENOUS
  Filled 2011-10-19: qty 10

## 2011-10-19 NOTE — Progress Notes (Signed)
Julien is here after seeing her GYN for vaginal bleeding.  She unterwent endometrial curettage for 11 uterine polyp.  Since that time, she has not had vaginal bleeding.  An FSH level was attained by GYN on 08/30/2011 which revealed a FSH level of 43.88 which places Sheila Jefferson in the Post-Menopausal group BUT this is unreliable in pre-menopausal women.   Elvis is very concerned about taking the Tamoxifen due to the listed side-effects of the medications including endometrial cancer and vaginal bleeding.  She was asked to talk to [place] by her GYN due to the listed side effects.   I printed the side effect profile of Tamoxifen and all 3 aromatase inhibitors for her to have and review.  She understands that they all have that listed side effect and it is less than 1%.  We discussed the mechanism of action of the aromatase inhibitor and Tamoxifen and this is the reason for AI for post-menopausal women and Tamoxifen for pre-menopausal.  We discussed the [facility] results she provided [place] and its unreliability in pre-menopausal women.  We discussed the most common side effects of all of the medications including the categorical side effects of hot flashes, arthralgias, and myalgias.   She was advised that the blocking of estrogen will not make her develop female-like changes.  She is fearful of vaginal bleeding again.  She reports that she had continuous vaginal blood loss for months during therapy.    She reports a lump in her right axilla, but on physical exam it is a "Zit" with a white head appreciated.  It is tender and erythematous.   After a long discussion, she is agreeable to take the Tamoxifen.  She will start that this Friday 10/21/2011.  She will contact us with any problems.  We will see her in 6 weeks for a follow-up appointment.  If she is doing well, she is invited to cancel that appointment and see Dr. Mariel Sleet in Feb as scheduled. She knows to call the clinic with any questions or concerns.    Patient and plan  discussed with Dr. Mariel Sleet and he is in agreement with the aforementioned.  Time spent in 1 on 1 conversation with the patient was 25 minutes.  Total time spent was 30 minutes.   KEFALAS,THOMAS

## 2011-10-19 NOTE — Progress Notes (Signed)
Sheila Jefferson presented for Portacath access and flush. Proper placement of portacath confirmed by CXR. Portacath located rt chest wall accessed with  H 20 needle. Good blood return present. Portacath flushed with 20ml NS and 500U/63ml Heparin and needle removed intact. Procedure without incident. Patient tolerated procedure well.

## 2011-10-26 ENCOUNTER — Encounter (HOSPITAL_COMMUNITY): Payer: BC Managed Care – PPO

## 2011-11-07 NOTE — Progress Notes (Signed)
Coming in for local [facility] out-reviewed instructions

## 2011-11-10 ENCOUNTER — Encounter (HOSPITAL_BASED_OUTPATIENT_CLINIC_OR_DEPARTMENT_OTHER): Payer: Self-pay

## 2011-11-10 ENCOUNTER — Ambulatory Visit (HOSPITAL_BASED_OUTPATIENT_CLINIC_OR_DEPARTMENT_OTHER)
Admission: RE | Admit: 2011-11-10 | Discharge: 2011-11-10 | Disposition: A | Discharge status: Home / Self Care | Payer: BC Managed Care – PPO | Source: Ambulatory Visit | Attending: Surgery | Admitting: Surgery

## 2011-11-10 ENCOUNTER — Encounter (HOSPITAL_BASED_OUTPATIENT_CLINIC_OR_DEPARTMENT_OTHER)
Admission: RE | Disposition: A | Discharge status: Home / Self Care | Payer: Self-pay | Source: Ambulatory Visit | Attending: Surgery

## 2011-11-10 DIAGNOSIS — Z452 Encounter for adjustment and management of vascular access device: Secondary | ICD-10-CM

## 2011-11-10 DIAGNOSIS — C50419 Malignant neoplasm of upper-outer quadrant of unspecified female breast: Secondary | ICD-10-CM

## 2011-11-10 DIAGNOSIS — Z853 Personal history of malignant neoplasm of breast: Secondary | ICD-10-CM | POA: Insufficient documentation

## 2011-11-10 HISTORY — PX: PORT-A-CATH REMOVAL: SHX5289

## 2011-11-10 SURGERY — MINOR REMOVAL PORT-A-CATH
Anesthesia: LOCAL | Site: Chest | Laterality: Left | Wound class: Clean

## 2011-11-10 MED ORDER — LIDOCAINE-EPINEPHRINE (PF) 1 %-1:200000 IJ SOLN
INTRAMUSCULAR | Status: DC | PRN
  Administered 2011-11-10: 10 mL

## 2011-11-10 SURGICAL SUPPLY — 29 items
APL SKNCLS STERI-STRIP NONHPOA (GAUZE/BANDAGES/DRESSINGS)
BENZOIN TINCTURE PRP APPL 2/3 (GAUZE/BANDAGES/DRESSINGS) IMPLANT
BLADE SURG 15 STRL LF DISP TIS (BLADE) ×1 IMPLANT
BLADE SURG 15 STRL SS (BLADE) ×1
CHLORAPREP W/TINT 26ML (MISCELLANEOUS) ×2 IMPLANT
CLOTH BEACON ORANGE TIMEOUT ST (SAFETY) ×2 IMPLANT
DERMABOND ADVANCED (GAUZE/BANDAGES/DRESSINGS) ×1
DERMABOND ADVANCED .7 DNX12 (GAUZE/BANDAGES/DRESSINGS) ×1 IMPLANT
DRSG TEGADERM 4X4.75 (GAUZE/BANDAGES/DRESSINGS) IMPLANT
ELECT REM PT RETURN 9FT ADLT (ELECTROSURGICAL)
ELECTRODE REM PT RTRN 9FT ADLT (ELECTROSURGICAL) IMPLANT
GAUZE SPONGE 4X4 12PLY STRL LF (GAUZE/BANDAGES/DRESSINGS) IMPLANT
GAUZE SPONGE 4X4 16PLY XRAY LF (GAUZE/BANDAGES/DRESSINGS) IMPLANT
GLOVE EUDERMIC 7 POWDERFREE (GLOVE) ×2 IMPLANT
GLOVE SKINSENSE NS SZ7.0 (GLOVE) ×2
GLOVE SKINSENSE STRL SZ7.0 (GLOVE) ×2 IMPLANT
MARKER SKIN DUAL TIP RULER LAB (MISCELLANEOUS) ×2 IMPLANT
NDL SAFETY ECLIPSE 18X1.5 (NEEDLE) ×1 IMPLANT
NEEDLE HYPO 18GX1.5 SHARP (NEEDLE) ×2
NEEDLE HYPO 25X1 1.5 SAFETY (NEEDLE) ×2 IMPLANT
PENCIL BUTTON HOLSTER BLD 10FT (ELECTRODE) IMPLANT
STRIP CLOSURE SKIN 1/2X4 (GAUZE/BANDAGES/DRESSINGS) IMPLANT
SUT MNCRL AB 4-0 PS2 18 (SUTURE) ×2 IMPLANT
SUT VIC AB 3-0 FS2 27 (SUTURE) IMPLANT
SUT VIC AB 4-0 BRD 54 (SUTURE) IMPLANT
SUT VIC AB 4-0 P-3 18XBRD (SUTURE) IMPLANT
SUT VIC AB 4-0 P3 18 (SUTURE)
SUT VIC AB 4-0 SH 18 (SUTURE) ×2 IMPLANT
SYR CONTROL 10ML LL (SYRINGE) ×2 IMPLANT

## 2011-11-10 NOTE — Op Note (Signed)
Sheila Jefferson [birth year] [id number] 10/11/2011  Preoperative diagnosis: Un-Needed PAC  Postoperative diagnosis: Same  Procedure: Portacath Removal  Surgeon: [name], MD, FACS  Anesthesia:local   Clinical History and Indications: The patient has finished her chemotherapy and no longer needs a port. She wishes to have it removed.  Procedure: The patient was seen in the preoperative area and we confirmed the plans for the procedure as noted above. The Port-A-Cath site was identified and marked. The patient had no further questions.  The patient was then taken into the procedure room. The timeout was done. The area over the Port-A-Cath was anesthetized with 1% Xylocaine with epinephrine. I waited about 10 minutes and then the area was prepped and draped.  The old scar was opened. The capsule around the port opened and the port identified. The holding sutures were cut. The catheter was backed partially out of its tract. A figure 8 3-0 Vicryl suture was placed, the tubing removed, and the suture tied down to prevent backbleeding.  The port was then removed from its pocket. I made sure everything was dry. The incision was closed with 3-0 Vicry, and Dermabond.  The patient tolerated the procedure well. There were no complications.  [name], MD, FACS 11/10/2011 7:56 AM

## 2011-11-10 NOTE — H&P (Signed)
CC: Un-needed PAC  HPI: This patient has completed chemo for her breast cancer and wishes her port removed. She has not current illnesses or sx  Exam: VS; BP 123/80  Pulse 94  Temp 98.5 F (36.9 C) (Oral)  Resp 18  SpO2 95% Gen: Alert NAD Chest wall: Port in left anterior chest area - no evidence of infection  IMP: Retained PAC  Plan; Remove under local. Discussed with patient and she has no questions and is ready to proceed

## 2011-11-11 ENCOUNTER — Encounter (HOSPITAL_BASED_OUTPATIENT_CLINIC_OR_DEPARTMENT_OTHER): Payer: Self-pay | Admitting: Surgery

## 2011-11-30 ENCOUNTER — Ambulatory Visit (HOSPITAL_COMMUNITY): Payer: BC Managed Care – PPO | Admitting: Oncology

## 2011-12-08 ENCOUNTER — Other Ambulatory Visit (HOSPITAL_COMMUNITY): Payer: Self-pay | Admitting: Oncology

## 2011-12-08 DIAGNOSIS — R609 Edema, unspecified: Secondary | ICD-10-CM

## 2011-12-08 MED ORDER — FUROSEMIDE 20 MG PO TABS: 20.0000 mg | ORAL_TABLET | Freq: Every day | ORAL | Status: DC | PRN

## 2011-12-08 MED ORDER — POTASSIUM CHLORIDE CRYS ER 20 MEQ PO TBCR: EXTENDED_RELEASE_TABLET | ORAL | Status: DC

## 2011-12-26 ENCOUNTER — Other Ambulatory Visit (HOSPITAL_COMMUNITY): Payer: Self-pay | Admitting: Oncology

## 2011-12-26 ENCOUNTER — Telehealth (HOSPITAL_COMMUNITY): Payer: Self-pay | Admitting: *Deleted

## 2011-12-26 DIAGNOSIS — G629 Polyneuropathy, unspecified: Secondary | ICD-10-CM

## 2011-12-26 MED ORDER — GABAPENTIN 300 MG PO CAPS: ORAL_CAPSULE | ORAL | Status: DC

## 2011-12-26 NOTE — Telephone Encounter (Signed)
Needs refills escribed to [facility] [place] for her gabapentin. Has only 1 pill left. Takes 900 mg daily.  [facility] [place].

## 2012-02-27 ENCOUNTER — Other Ambulatory Visit (HOSPITAL_COMMUNITY): Payer: BC Managed Care – PPO

## 2012-02-28 ENCOUNTER — Ambulatory Visit (HOSPITAL_COMMUNITY): Payer: BC Managed Care – PPO | Admitting: Oncology

## 2012-03-13 ENCOUNTER — Encounter (HOSPITAL_COMMUNITY): Payer: BC Managed Care – PPO | Attending: Oncology

## 2012-03-13 DIAGNOSIS — J449 Chronic obstructive pulmonary disease, unspecified: Secondary | ICD-10-CM | POA: Insufficient documentation

## 2012-03-13 DIAGNOSIS — C50419 Malignant neoplasm of upper-outer quadrant of unspecified female breast: Secondary | ICD-10-CM

## 2012-03-13 DIAGNOSIS — Z853 Personal history of malignant neoplasm of breast: Secondary | ICD-10-CM | POA: Insufficient documentation

## 2012-03-13 DIAGNOSIS — J4489 Other specified chronic obstructive pulmonary disease: Secondary | ICD-10-CM | POA: Insufficient documentation

## 2012-03-13 DIAGNOSIS — F17200 Nicotine dependence, unspecified, uncomplicated: Secondary | ICD-10-CM | POA: Insufficient documentation

## 2012-03-13 DIAGNOSIS — Z09 Encounter for follow-up examination after completed treatment for conditions other than malignant neoplasm: Secondary | ICD-10-CM | POA: Insufficient documentation

## 2012-03-13 LAB — COMPREHENSIVE METABOLIC PANEL
ALT: 13 U/L (ref 0–35)
AST: 12 U/L (ref 0–37)
Alkaline Phosphatase: 81 U/L (ref 39–117)
CO2: 27 mEq/L (ref 19–32)
Calcium: 9.5 mg/dL (ref 8.4–10.5)
Chloride: 101 mEq/L (ref 96–112)
GFR calc Af Amer: >90 mL/min (ref >90)
GFR calc non Af Amer: >90 mL/min (ref >90)
Glucose, Bld: 129 mg/dL — ABNORMAL HIGH (ref 70–99)
Potassium: 3.9 mEq/L (ref 3.5–5.1)
Sodium: 139 mEq/L (ref 135–145)
Total Bilirubin: 0.2 mg/dL — ABNORMAL LOW (ref 0.3–1.2)

## 2012-03-13 LAB — CBC WITH DIFFERENTIAL/PLATELET
Basophils Absolute: 0 10*3/uL (ref 0.0–0.1)
Lymphocytes Relative: 22 % (ref 12–46)
Lymphs Abs: 1.6 10*3/uL (ref 0.7–4.0)
MCV: 93.1 fL (ref 78.0–100.0)
Neutro Abs: 5.1 10*3/uL (ref 1.7–7.7)
Neutrophils Relative %: 70 % (ref 43–77)
Platelets: 222 10*3/uL (ref 150–400)
RBC: 4.8 MIL/uL (ref 3.87–5.11)
RDW: 13.6 % (ref 11.5–15.5)
WBC: 7.2 10*3/uL (ref 4.0–10.5)

## 2012-03-13 NOTE — Progress Notes (Signed)
Labs drawn today for cbc/diff,cmp,mg 

## 2012-03-16 ENCOUNTER — Encounter (HOSPITAL_BASED_OUTPATIENT_CLINIC_OR_DEPARTMENT_OTHER): Payer: BC Managed Care – PPO | Admitting: Oncology

## 2012-03-16 ENCOUNTER — Other Ambulatory Visit (HOSPITAL_COMMUNITY): Payer: Self-pay | Admitting: Oncology

## 2012-03-16 ENCOUNTER — Encounter (HOSPITAL_COMMUNITY): Payer: Self-pay | Admitting: Oncology

## 2012-03-16 VITALS — BP 154/88 | HR 96 | Temp 97.8°F | Resp 18 | Wt 191.0 lb

## 2012-03-16 DIAGNOSIS — Z9889 Other specified postprocedural states: Secondary | ICD-10-CM

## 2012-03-16 DIAGNOSIS — J449 Chronic obstructive pulmonary disease, unspecified: Secondary | ICD-10-CM

## 2012-03-16 DIAGNOSIS — G609 Hereditary and idiopathic neuropathy, unspecified: Secondary | ICD-10-CM

## 2012-03-16 DIAGNOSIS — C50419 Malignant neoplasm of upper-outer quadrant of unspecified female breast: Secondary | ICD-10-CM

## 2012-03-16 DIAGNOSIS — C50411 Malignant neoplasm of upper-outer quadrant of right female breast: Secondary | ICD-10-CM

## 2012-03-16 DIAGNOSIS — C773 Secondary and unspecified malignant neoplasm of axilla and upper limb lymph nodes: Secondary | ICD-10-CM

## 2012-03-16 NOTE — Progress Notes (Signed)
Problem number 1 stage II, grade 2, infiltrating ductal carcinoma the right breast, 2.5 cm in size with one positive node out of 15 and one additional high axillary node given her total 16 nodes. HER-2/neu negative, ER +90%, PR +80%, Ki-67 marker high at 71%, status post dose dense Adriamycin and Cytoxan for 4 cycles followed by Taxotere for 4 cycles. #2 peripheral neuropathy secondary to chemotherapy still on Neurontin 3 times a day which she will continue for another 3 months and try to decrease it to one twice a day. She has attempted that once but it failed approximate month ago. She may need 18 months of completion of chemotherapy to see how good result she gets from nerve healing. #3 COPD still smoking #4 excessive weight #5 history of bulging lumbar disc in the past #6 history of chronic "hormonal" imbalance #7 history of GERD  She states she is almost no junk food and no sugar added foods. 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 She gained 20+ pounds she states last year.  Oncology review of systems note remains negative.  BP 154/88  Pulse 96  Temp(Src) 97.8 F (36.6 C) (Oral)  Resp 18  Wt 191 lb (86.637 kg)  BMI 31.78 kg/m2  She had a number of questions which hopefully we've answered to her satisfaction about her peripheral neuropathy, her hot flashes aren't bad but she does not want to take a laxative pain. Her energy level is getting better she states her bowels are working fine she states as well  She is in no acute distress. She has no lymphadenopathy in the cervical, supraclavicular, infraclavicular, axillary, epitrochlear, or anal areas. She does have mild right arm edema but is wearing a sleeve and gauntlet. Left arm is negative. Both breasts reveal no masses. She has mild changes her surgery radiation of the right breast. Still tender. Lungs are clear but decreased breath sounds are present. She has cigarette odor on her breath. Abdomen remains obese without obvious hepatosplenomegaly.  Bowel sounds are diminished. She has no leg edema.  Skin exam shows no obvious malignancy.  We will see her in 4 months this time and she will continue her tamoxifen. She'll take that for at least 5 years and may benefit from 10 years potentially.

## 2012-03-16 NOTE — Patient Instructions (Addendum)
[facility] Discharge Instructions  RECOMMENDATIONS MADE BY THE CONSULTANT AND ANY TEST RESULTS WILL BE SENT TO YOUR REFERRING PHYSICIAN.  EXAM FINDINGS BY THE PHYSICIAN TODAY AND SIGNS OR SYMPTOMS TO REPORT TO CLINIC OR PRIMARY PHYSICIAN: exam and discussion by MD.  Bonita Quin are doing well.  Keep an eye on the amount of free sugar that you eat and drink.  Continue the neurontin 3 daily for at least 3 more months.  MEDICATIONS PRESCRIBED:  none  INSTRUCTIONS GIVEN AND DISCUSSED: Use sunscreen whenever you are in the sun.  If you are going in the pool, reapply your sunscreen at least every 2 hours.  Report any new lumps, bone pain or shortness of breath.  SPECIAL INSTRUCTIONS/FOLLOW-UP: Follow-up in 4 months and if you are doing well then we will extend it to 6 months for follow-up.  Thank you for choosing [facility] to provide your oncology and hematology care.  To afford each patient quality time with our providers, please arrive at least 15 minutes before your scheduled appointment time.  With your help, our goal is to use those 15 minutes to complete the necessary work-up to ensure our physicians have the information they need to help with your evaluation and healthcare recommendations.    Effective January 1st, 2014, we ask that you re-schedule your appointment with our physicians should you arrive 10 or more minutes late for your appointment.  We strive to give you quality time with our providers, and arriving late affects you and other patients whose appointments are after yours.    Again, thank you for choosing [facility].  Our [place] is that these requests will decrease the amount of time that you wait before being seen by our physicians.       _____________________________________________________________  Should you have questions after your visit to [facility], please contact our office at [phone] between the hours of  8:30 a.m. and 5:00 p.m.  Voicemails left after 4:30 p.m. will not be returned until the following business day.  For prescription refill requests, have your pharmacy contact our office with your prescription refill request.

## 2012-03-21 ENCOUNTER — Other Ambulatory Visit (HOSPITAL_COMMUNITY): Payer: Self-pay | Admitting: Oncology

## 2012-03-21 ENCOUNTER — Encounter (HOSPITAL_COMMUNITY): Payer: BC Managed Care – PPO

## 2012-03-21 ENCOUNTER — Ambulatory Visit (HOSPITAL_COMMUNITY)
Admission: RE | Admit: 2012-03-21 | Discharge: 2012-03-21 | Disposition: A | Discharge status: Home / Self Care | Payer: BC Managed Care – PPO | Source: Ambulatory Visit | Attending: Oncology | Admitting: Oncology

## 2012-03-21 DIAGNOSIS — Z9889 Other specified postprocedural states: Secondary | ICD-10-CM

## 2012-03-21 DIAGNOSIS — C50919 Malignant neoplasm of unspecified site of unspecified female breast: Secondary | ICD-10-CM | POA: Insufficient documentation

## 2012-04-04 ENCOUNTER — Ambulatory Visit (INDEPENDENT_AMBULATORY_CARE_PROVIDER_SITE_OTHER): Payer: BC Managed Care – PPO | Admitting: Surgery

## 2012-04-04 ENCOUNTER — Encounter (INDEPENDENT_AMBULATORY_CARE_PROVIDER_SITE_OTHER): Payer: Self-pay | Admitting: Surgery

## 2012-04-04 VITALS — BP 110/78 | HR 98 | Resp 18 | Ht 65.0 in | Wt 186.0 lb

## 2012-04-04 DIAGNOSIS — Z853 Personal history of malignant neoplasm of breast: Secondary | ICD-10-CM

## 2012-04-04 NOTE — Progress Notes (Signed)
NAME: Sheila Jefferson       DOB: 1965-09-17           DATE: 04/04/2012       MRN: [id number]   Addley M Mcdonell is a 47 y.o.Marland Kitchenfemale who presents for routine followup of her stage IIB Right breast cancer,receptor positive, HER-2 negative, diagnosed in 2013 and treated with lumpectomy, node dissection, chemotherapy, radiation therapy.. She has no problems or concerns on either side.she has been troubled with neuropathy in both hands and both feet. She thinks the neuropathy in her hands is getting better. She is also developed a little lymphedema of the right arm and now wears a sleeve at night. PFSH: She has had no significant changes since the last visit here except the development of the lymphedema and progression of the neuropathy  ROS: There have been no significant changes since the last visit here  EXAM:  VS: BP 110/78  Pulse 98  Resp 18  Ht 5\' 5"  (1.651 m)  Wt 186 lb (84.369 kg)  BMI 30.95 kg/m2  LMP 01/06/2011  General: The patient is alert, oriented, generally healthy appearing, NAD. Mood and affect are normal.  Breasts: Right breast shows some radiation changes with increased pigmentation. The both the lumpectomy and the lymph node site are well-healed there is no evidence of local recurrences or other problems. The tissue there is fairly soft. The left breast is normal to inspection and palpation.  Lymphatics: She has no axillary or supraclavicular adenopathy on either side.  Extremities: Full ROM of the surgical side with mild right arm lymphedema  Data reviewed:mammogram report is pending but was just done. She was told it was normal. Impression: Doing well, with no evidence of recurrent cancer or new cancer Mild right arm lymphedema  Plan: I will see her back in about 6 months.

## 2012-04-04 NOTE — Patient Instructions (Signed)
Continue annual mammograms. Come back to see me in 6 months.

## 2012-04-26 ENCOUNTER — Other Ambulatory Visit (HOSPITAL_COMMUNITY): Payer: Self-pay | Admitting: Oncology

## 2012-04-26 ENCOUNTER — Encounter (HOSPITAL_COMMUNITY): Payer: BC Managed Care – PPO | Attending: Oncology

## 2012-04-26 DIAGNOSIS — R252 Cramp and spasm: Secondary | ICD-10-CM

## 2012-04-26 DIAGNOSIS — C773 Secondary and unspecified malignant neoplasm of axilla and upper limb lymph nodes: Secondary | ICD-10-CM

## 2012-04-26 DIAGNOSIS — C50419 Malignant neoplasm of upper-outer quadrant of unspecified female breast: Secondary | ICD-10-CM

## 2012-04-26 LAB — BASIC METABOLIC PANEL
BUN: 11 mg/dL (ref 6–23)
Creatinine, Ser: 0.68 mg/dL (ref 0.50–1.10)
GFR calc non Af Amer: >90 mL/min (ref >90)
Glucose, Bld: 94 mg/dL (ref 70–99)
Potassium: 4.1 mEq/L (ref 3.5–5.1)

## 2012-04-26 NOTE — Addendum Note (Signed)
Addended by: Dennie Maizes on: 04/26/2012 01:20 PM   Modules accepted: Orders

## 2012-04-26 NOTE — Progress Notes (Signed)
Labs drawn today for bmp 

## 2012-06-23 ENCOUNTER — Other Ambulatory Visit (HOSPITAL_COMMUNITY): Payer: Self-pay | Admitting: Oncology

## 2012-07-16 ENCOUNTER — Ambulatory Visit (HOSPITAL_COMMUNITY): Payer: BC Managed Care – PPO | Admitting: Oncology

## 2012-08-15 ENCOUNTER — Other Ambulatory Visit (HOSPITAL_COMMUNITY): Payer: Self-pay | Admitting: Oncology

## 2012-08-15 DIAGNOSIS — C50911 Malignant neoplasm of unspecified site of right female breast: Secondary | ICD-10-CM

## 2012-09-12 ENCOUNTER — Other Ambulatory Visit (HOSPITAL_COMMUNITY): Payer: Self-pay | Admitting: Oncology

## 2012-09-12 DIAGNOSIS — C50411 Malignant neoplasm of upper-outer quadrant of right female breast: Secondary | ICD-10-CM

## 2012-09-12 MED ORDER — TAMOXIFEN CITRATE 20 MG PO TABS: 20.0000 mg | ORAL_TABLET | Freq: Every day | ORAL | Status: DC

## 2012-09-18 ENCOUNTER — Encounter (HOSPITAL_COMMUNITY): Payer: BC Managed Care – PPO | Attending: Oncology | Admitting: Oncology

## 2012-09-18 ENCOUNTER — Encounter (HOSPITAL_COMMUNITY): Payer: Self-pay | Admitting: Oncology

## 2012-09-18 VITALS — BP 128/81 | HR 106 | Temp 98.5°F | Resp 20 | Wt 192.2 lb

## 2012-09-18 DIAGNOSIS — C50411 Malignant neoplasm of upper-outer quadrant of right female breast: Secondary | ICD-10-CM

## 2012-09-18 DIAGNOSIS — R232 Flushing: Secondary | ICD-10-CM

## 2012-09-18 DIAGNOSIS — Z09 Encounter for follow-up examination after completed treatment for conditions other than malignant neoplasm: Secondary | ICD-10-CM | POA: Insufficient documentation

## 2012-09-18 DIAGNOSIS — N951 Menopausal and female climacteric states: Secondary | ICD-10-CM

## 2012-09-18 DIAGNOSIS — C50419 Malignant neoplasm of upper-outer quadrant of unspecified female breast: Secondary | ICD-10-CM

## 2012-09-18 DIAGNOSIS — Z853 Personal history of malignant neoplasm of breast: Secondary | ICD-10-CM | POA: Insufficient documentation

## 2012-09-18 LAB — COMPREHENSIVE METABOLIC PANEL WITH GFR
ALT: 17 U/L (ref 0–35)
AST: 18 U/L (ref 0–37)
Albumin: 3.3 g/dL — ABNORMAL LOW (ref 3.5–5.2)
Alkaline Phosphatase: 83 U/L (ref 39–117)
BUN: 12 mg/dL (ref 6–23)
CO2: 25 meq/L (ref 19–32)
Calcium: 9.3 mg/dL (ref 8.4–10.5)
Chloride: 105 meq/L (ref 96–112)
Creatinine, Ser: 0.59 mg/dL (ref 0.50–1.10)
GFR calc Af Amer: >90 mL/min
GFR calc non Af Amer: >90 mL/min
Glucose, Bld: 128 mg/dL — ABNORMAL HIGH (ref 70–99)
Potassium: 3.8 meq/L (ref 3.5–5.1)
Sodium: 139 meq/L (ref 135–145)
Total Bilirubin: <0.1 mg/dL — ABNORMAL LOW (ref 0.3–1.2)
Total Protein: 6.8 g/dL (ref 6.0–8.3)

## 2012-09-18 LAB — CBC WITH DIFFERENTIAL/PLATELET
Basophils Relative: 1 % (ref 0–1)
Eosinophils Absolute: 0.2 10*3/uL (ref 0.0–0.7)
Eosinophils Relative: 3 % (ref 0–5)
Hemoglobin: 14.3 g/dL (ref 12.0–15.0)
Lymphs Abs: 1.7 10*3/uL (ref 0.7–4.0)
MCH: 31.4 pg (ref 26.0–34.0)
MCHC: 33.7 g/dL (ref 30.0–36.0)
MCV: 93.2 fL (ref 78.0–100.0)
Monocytes Relative: 8 % (ref 3–12)
Platelets: 220 10*3/uL (ref 150–400)
RBC: 4.55 MIL/uL (ref 3.87–5.11)

## 2012-09-18 MED ORDER — VENLAFAXINE HCL ER 75 MG PO CP24: 75.0000 mg | ORAL_CAPSULE | Freq: Every day | ORAL | Status: DC

## 2012-09-18 NOTE — Progress Notes (Signed)
Sheila Flavin, MD [address] [place] [address]  Cancer of upper-outer quadrant of female breast, right  CURRENT THERAPY: Tamoxifen 20 mg daily beginning on 10/21/2011  INTERVAL HISTORY: Sheila Jefferson 47 y.o. female returns for  regular  visit for followup of Stage II, grade 2, infiltrating ductal carcinoma the right breast, 2.5 cm in size with one positive node out of 15 and one additional high axillary node given her total 16 nodes. HER-2/neu negative, ER +90%, PR +80%, Ki-67 marker high at 71%, status post dose dense Adriamycin and Cytoxan for 4 cycles followed by Taxotere for 4 cycles.  She is now on Tamoxifen 20 mg daily beginning on 10/21/2011.  She reports that her biggest complaint is hot flashes.  They interrupt her ADLs and have become more difficult to manage at night preventing her from sleeping.  We discussed anecdotal treatment for hot flashes including SSRIs such as Effexor. She is agreeable to try this treatment.  I discussed the risks, benefits, alternatives, and side effects of this medication.  I will start her at 75 mg daily.  She will try the medication for 1 month and if ineffective, she will stop the medication.   Otherwise, she reports intermittent knee pain that is well controlled with NSAIDs.  She avoids narcotics for this discomfort.  She reports that when she takes an NSAID, it alleviates the discomfort.   We spent a long time discussing her future endocrine therapy as we have a few choices per NCCN guidelines without any clear contraindication.  She can take 5 years worth of Tamoxifen, but she may be an excellent candidate for 10 years of therapy.  Another choice is switching her to an AI in the future when she is shown to be post-menopausal for 5 years.  We have plenty of time to sort this issue out, but I at least wanted to get her thinking about these options.   Oncologically, she denies any complaints and ROS questioning is negative.     Past Medical  History  Diagnosis Date  . GERD (gastroesophageal reflux disease)   . Bulging lumbar disc   . Cancer of upper-outer quadrant of female breast 01/06/2011    right breast    has Cancer of upper-outer quadrant of female breast on her problem list.     has No Known Allergies.  Ms. Mowers does not currently have medications on file.  Past Surgical History  Procedure Laterality Date  . Breast reduction surgery  1997  . Cholecystectomy  2003  . Tubal ligation  2003  . Pilonidal cyst      1980-81-83  . Colonoscopy    . Portacath placement  02/11/2011    Procedure: INSERTION PORT-A-CATH;  Surgeon: [name], MD;  Location: [facility] OR;  Service: General;  Laterality: Left;  INSERTION PORT-A-CATH LEFT SUBCLAVIAN  . Breast lumpectomy  02/11/2011    right breast and axillary dissection  . Uterine fibroid surgery  09/30/2011    removal 8 polyps - Dr Francee Piccolo  . Port-a-cath removal  11/10/2011    Procedure: MINOR REMOVAL PORT-A-CATH;  Surgeon: [name], MD;  Location: [facility];  Service: General;  Laterality: Left;    Denies any headaches, dizziness, double vision, fevers, chills, night sweats, nausea, vomiting, diarrhea, constipation, chest pain, heart palpitations, shortness of breath, blood in stool, black tarry stool, urinary pain, urinary burning, urinary frequency, hematuria.   PHYSICAL EXAMINATION  ECOG PERFORMANCE STATUS: 0 - Asymptomatic  Filed Vitals:   09/18/12 1016  BP: 128/81  Pulse: 106  Temp: 98.5 F (36.9 C)  Resp: 20    GENERAL:alert, no distress, well nourished, well developed, comfortable, cooperative, obese and smiling SKIN: skin color, texture, turgor are normal, no rashes or significant lesions HEAD: Normocephalic, No masses, lesions, tenderness or abnormalities EYES: normal, PERRLA, EOMI, Conjunctiva are pink and non-injected EARS: External ears normal OROPHARYNX:mucous membranes are moist  NECK: supple, no adenopathy, thyroid normal  size, non-tender, without nodularity, no stridor, non-tender, trachea midline LYMPH:  no palpable lymphadenopathy, no hepatosplenomegaly BREAST:breasts appear normal, no suspicious masses, no skin or nipple changes or axillary nodes, with some right breast radiation changes. LUNGS: clear to auscultation and percussion HEART: regular rate & rhythm, no murmurs, no gallops, S1 normal and S2 normal ABDOMEN:abdomen soft, non-tender, obese, normal bowel sounds, no masses or organomegaly and no hepatosplenomegaly BACK: Back symmetric, no curvature., No CVA tenderness EXTREMITIES:less then 2 second capillary refill, no joint deformities, effusion, or inflammation, no skin discoloration, no clubbing, no cyanosis, positive findings:  edema right UE lymphedema  NEURO: alert & oriented x 3 with fluent speech, no focal motor/sensory deficits, gait normal    LABORATORY DATA: CBC    Component Value Date/Time   WBC 7.2 03/13/2012 0923   RBC 4.80 03/13/2012 0923   HGB 14.9 03/13/2012 0923   HCT 44.7 03/13/2012 0923   PLT 222 03/13/2012 0923   MCV 93.1 03/13/2012 0923   MCH 31.0 03/13/2012 0923   MCHC 33.3 03/13/2012 0923   RDW 13.6 03/13/2012 0923   LYMPHSABS 1.6 03/13/2012 0923   MONOABS 0.4 03/13/2012 0923   EOSABS 0.2 03/13/2012 0923   BASOSABS 0.0 03/13/2012 0923      Chemistry      Component Value Date/Time   NA 140 04/26/2012 1046   K 4.1 04/26/2012 1046   CL 101 04/26/2012 1046   CO2 26 04/26/2012 1046   BUN 11 04/26/2012 1046   CREATININE 0.68 04/26/2012 1046      Component Value Date/Time   CALCIUM 9.3 04/26/2012 1046   ALKPHOS 81 03/13/2012 0923   AST 12 03/13/2012 0923   ALT 13 03/13/2012 0923   BILITOT 0.2* 03/13/2012 0923        RADIOGRAPHIC STUDIES:  03/21/2012  *RADIOLOGY REPORT*  Clinical Data: History of malignant lumpectomy of the right breast  in 2013. Follow-up evaluation.  DIGITAL DIAGNOSTIC BILATERAL MAMMOGRAM WITH CAD  Comparison: 01/26/2011, 01/05/2011.  Findings:  ACR  Breast Density Category 2: There is a scattered fibroglandular  pattern.  There are mild scarring changes located within the upper-outer  quadrant of the right breast in a low axillary location related to  the patient's lumpectomy. There are sheet - like calcifications  seen in the lumpectomy bed which are felt to most likely represent  developing fat necrosis. There are no worrisome findings within  the left breast which has a stable appearance.  Mammographic images were processed with CAD.  IMPRESSION:  Post lumpectomy changes seen within the upper-outer quadrant right  breast in a lower axillary location. Probably benign developing  dystrophic calcifications in the lumpectomy bed. I recommend  follow-up right breast diagnostic mammogram in 6 months with  attention to this area.  RECOMMENDATION:  Right breast diagnostic mammogram in 6 months.  I have discussed the findings and recommendations with the patient.  Results were also provided in writing at the conclusion of the  visit.  BI-RADS CATEGORY 3: Probably benign finding(s) - short interval  follow-up suggested.  Original Report Authenticated By: Rolla Plate, M.D.    PATHOLOGY:  02/11/2012  ADDITIONAL INFORMATION: 1. CHROMOGENIC IN-SITU HYBRIDIZATION Interpretation HER-2/NEU BY CISH - NO AMPLIFICATION OF HER-2 DETECTED. THE RATIO OF HER-2: CEP 17 SIGNALS WAS 1.06. Reference range: Ratio: HER2:CEP17 < 1.8 - gene amplification not observed Ratio: HER2:CEP 17 1.8-2.2 - equivocal result Ratio: HER2:CEP17 > 2.2 - gene amplification observed Pecola Leisure MD Pathologist, Electronic Signature ( Signed 02/18/2011) FINAL DIAGNOSIS Diagnosis 1. Breast, lumpectomy, right - INVASIVE DUCTAL CARCINOMA, NOTTINGHAM COMBINED HISTOLOGIC GRADE II, 2.5 CM. - NO EVIDENCE OF ANGIOLYMPHATIC INVASION IDENTIFIED. - ALL THE RESECTION MARGINS ARE CLEAR. PLEASE SEE ONCOLOGY TEMPLATE FOR DETAIL. 2. Lymph nodes, regional resection, right  axillary contents - ONE OF FIFTEEN LYMPH NODES, POSITIVE FOR METASTATIC DUCTAL CARCINOMA (1/15). 3. Lymph node, biopsy, highest right axillary - ONE LYMPH NODE, NEGATIVE FOR METASTATIC CARCINOMA (0/1). Microscopic Comment 1. BREAST, INVASIVE TUMOR, WITH LYMPH NODE SAMPLING 1 of 3 FINAL for COMFORT, IVERSEN ([id number]) Microscopic Comment(continued) Specimen, including laterality: Right breast. Procedure: Right lumpectomy. Grade: II Tubule formation: 3 Nuclear pleomorphism: 2 Mitotic:2 Tumor size (gross measurement): 2.5 cm Margins: Negative Invasive, distance to closest margin: Superior and medial margins; 0.5 cm In-situ, distance to closest margin: N/A Lymphovascular invasion: Not identified. Ductal carcinoma in situ: N/A Lobular neoplasia: N/A Tumor focality: Unifocal Treatment effect: No. If present, treatment effect in breast tissue, lymph nodes or both: No. Extent of tumor: Skin: N/A Nipple: N/A Skeletal muscle: N/A Lymph nodes: # examined: 16 Lymph nodes with metastasis: 1 Isolated tumor cells (< 0.2 mm): N/A Micrometastasis: (> 0.2 mm and < 2.0 mm): N/A Macrometastasis: (> 2.0 mm): 1 Extracapsular extension: 0 Breast prognostic profile: Per previous report [id number] Estrogen receptor: 90%, positive Progesterone receptor: 80%, positive Her 2 neu: negative by FISH analysis. Her 2 neu will be repeated and an addendum report will follow. Ki-67: 71% Non-neoplastic breast: N/A TNM: pT2, pN1a, pMx (HCL:gt, 02/14/11) Abigail Miyamoto MD Pathologist, Electronic Signature (Case signed 02/14/2011)     ASSESSMENT:  1. Stage II, grade 2, infiltrating ductal carcinoma the right breast, 2.5 cm in size with one positive node out of 15 and one additional high axillary node given her total 16 nodes. HER-2/neu negative, ER +90%, PR +80%, Ki-67 marker high at 71%, status post dose dense Adriamycin and Cytoxan for 4 cycles followed by Taxotere for 4 cycles.  She is now on Tamoxifen 20  mg daily beginning on 10/21/2011 which she will take for 5-10 years +/- switch to AI when proven to be post-menopausal. 2. Peripheral neuropathy secondary to chemotherapy  3. COPD still smoking 4. Excessive weight  5. History of bulging lumbar disc in the past 6. History of chronic "hormonal" imbalance 7. GERD  Patient Active Problem List   Diagnosis Date Noted  . Cancer of upper-outer quadrant of female breast 01/06/2011     PLAN:  1. I personally reviewed and went over laboratory results with the patient. 2. I personally reviewed and went over radiographic studies with the patient. 3. Mammogram due this month and scheduled for 09/26/2012. 4. Labs today: CBC diff, CMET 5. Recommend annual Gyn visits while on Tamoxifen 6. Effexor 75 mg daily for hot Flashes 7. I printed information for her regarding Effexor study for hot flashes.  8. She will try Effexor daily x 4 weeks.  If ineffective, she can stop medication. 9. Long discussion regarding 5 versus 10 years of Tamoxifen or switching to AI when she is proven to  be post-menopausal. 10. Return in 6 months for follow-up.   THERAPY PLAN:  Charika would be a good candidate for 10 years worth of Tamoxifen versus a switch to an AI when proven to be post-menopausal (with FSH, LH, and estradiol per definition). She was encouraged to have GYN exam annually and continue with annual screening mammograms.   All questions were answered. The patient knows to call the clinic with any problems, questions or concerns. We can certainly see the patient much sooner if necessary.  Patient and plan discussed with Dr. Alla German and he is in agreement with the aforementioned.   KEFALAS,THOMAS

## 2012-09-18 NOTE — Patient Instructions (Addendum)
.  [facility] Discharge Instructions  RECOMMENDATIONS MADE BY THE CONSULTANT AND ANY TEST RESULTS WILL BE SENT TO YOUR REFERRING PHYSICIAN.  EXAM FINDINGS BY THE PHYSICIAN TODAY AND SIGNS OR SYMPTOMS TO REPORT TO CLINIC OR PRIMARY PHYSICIAN.   MEDICATIONS PRESCRIBED:  Given Effexor for hot flashes.  Follow up in 6 months with the Clinic, call for any new lumps, bumps or bone pain. Mammogram as scheduled. Follow up with your OB/GYN  Thank you for choosing [facility] to provide your oncology and hematology care.  To afford each patient quality time with our providers, please arrive at least 15 minutes before your scheduled appointment time.  With your help, our goal is to use those 15 minutes to complete the necessary work-up to ensure our physicians have the information they need to help with your evaluation and healthcare recommendations.    Effective January 1st, 2014, we ask that you re-schedule your appointment with our physicians should you arrive 10 or more minutes late for your appointment.  We strive to give you quality time with our providers, and arriving late affects you and other patients whose appointments are after yours.    Again, thank you for choosing [facility].  Our Giovana is that these requests will decrease the amount of time that you wait before being seen by our physicians.       _____________________________________________________________  Should you have questions after your visit to [facility], please contact our office at [phone] between the hours of 8:30 a.m. and 5:00 p.m.  Voicemails left after 4:30 p.m. will not be returned until the following business day.  For prescription refill requests, have your pharmacy contact our office with your prescription refill request.

## 2012-09-26 ENCOUNTER — Ambulatory Visit (HOSPITAL_COMMUNITY)
Admission: RE | Admit: 2012-09-26 | Discharge: 2012-09-26 | Disposition: A | Discharge status: Home / Self Care | Payer: BC Managed Care – PPO | Source: Ambulatory Visit | Attending: Oncology | Admitting: Oncology

## 2012-09-26 DIAGNOSIS — C50911 Malignant neoplasm of unspecified site of right female breast: Secondary | ICD-10-CM

## 2012-09-26 DIAGNOSIS — R928 Other abnormal and inconclusive findings on diagnostic imaging of breast: Secondary | ICD-10-CM | POA: Insufficient documentation

## 2012-09-26 DIAGNOSIS — Z09 Encounter for follow-up examination after completed treatment for conditions other than malignant neoplasm: Secondary | ICD-10-CM | POA: Insufficient documentation

## 2012-12-12 IMAGING — CR DG CHEST 1V PORT
1 series · 1 of 1 positions shown · non-contrast
Comparison: CT chest of 02/01/2011

CLINICAL DATA: Placement of left Port-A-Cath, recent diagnosis of
breast carcinoma

PORTABLE CHEST - 1 VIEW

[view not recorded]
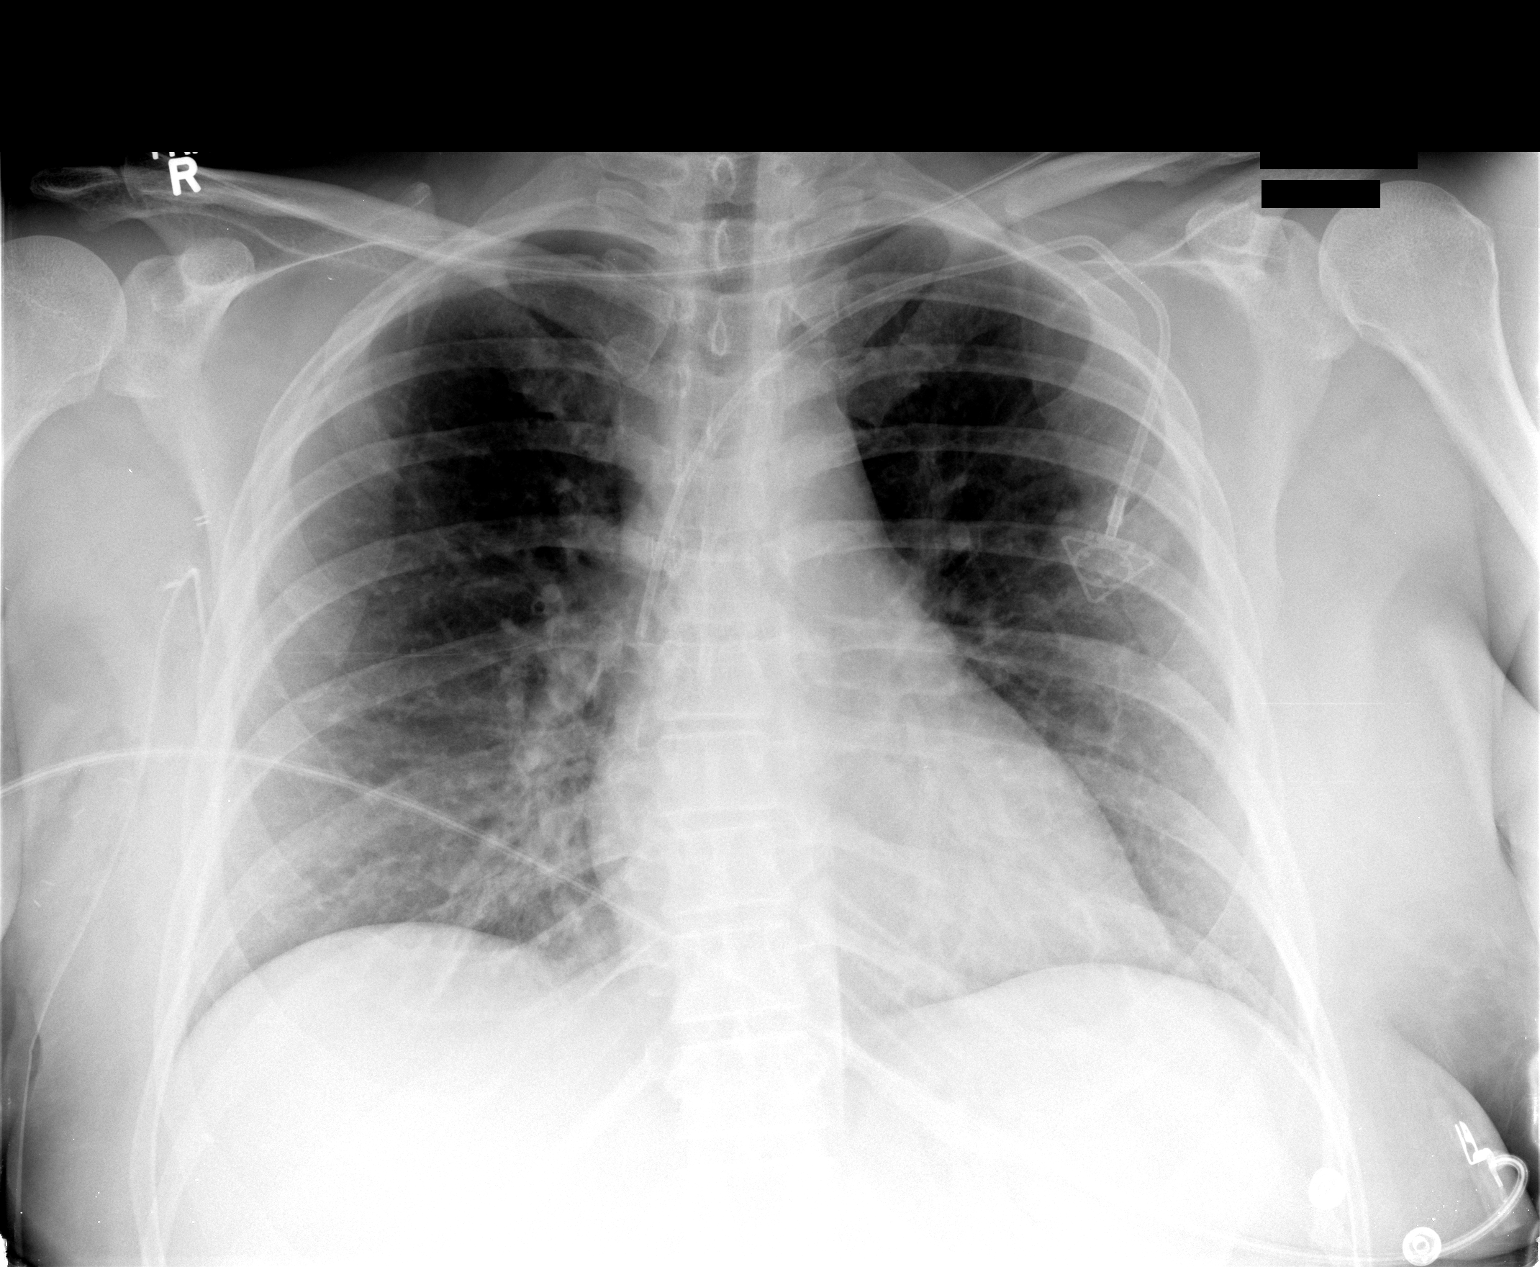

[1 of 1 positions shown; findings below may reference images not displayed]

FINDINGS: No active infiltrate or effusion is seen.  The heart is
within normal limits in size.  A left-sided power port Port-A-Cath
is present with the tip in the mid SVC.  No bony abnormality is
noted.
IMPRESSION: No active lung disease.  Left-sided Port-A-Cath tip in mid SVC.  No
pneumothorax.

## 2013-03-11 ENCOUNTER — Other Ambulatory Visit (HOSPITAL_COMMUNITY): Payer: Self-pay | Admitting: Oncology

## 2013-03-11 DIAGNOSIS — Z9889 Other specified postprocedural states: Secondary | ICD-10-CM

## 2013-03-12 ENCOUNTER — Other Ambulatory Visit (HOSPITAL_COMMUNITY): Payer: Self-pay | Admitting: Oncology

## 2013-03-12 DIAGNOSIS — C50419 Malignant neoplasm of upper-outer quadrant of unspecified female breast: Secondary | ICD-10-CM

## 2013-03-12 MED ORDER — TAMOXIFEN CITRATE 20 MG PO TABS: 20.0000 mg | ORAL_TABLET | Freq: Every day | ORAL | Status: DC

## 2013-03-21 ENCOUNTER — Ambulatory Visit (HOSPITAL_COMMUNITY): Payer: BC Managed Care – PPO

## 2013-03-22 NOTE — Progress Notes (Signed)
This encounter was created in error - please disregard.

## 2013-03-25 ENCOUNTER — Encounter (HOSPITAL_BASED_OUTPATIENT_CLINIC_OR_DEPARTMENT_OTHER): Payer: BC Managed Care – PPO

## 2013-03-25 ENCOUNTER — Encounter (HOSPITAL_COMMUNITY): Payer: BC Managed Care – PPO | Attending: Hematology and Oncology

## 2013-03-25 ENCOUNTER — Encounter (HOSPITAL_COMMUNITY): Payer: Self-pay

## 2013-03-25 VITALS — BP 123/85 | HR 103 | Temp 98.0°F | Resp 16 | Wt 197.1 lb

## 2013-03-25 DIAGNOSIS — C50419 Malignant neoplasm of upper-outer quadrant of unspecified female breast: Secondary | ICD-10-CM

## 2013-03-25 DIAGNOSIS — G609 Hereditary and idiopathic neuropathy, unspecified: Secondary | ICD-10-CM

## 2013-03-25 DIAGNOSIS — C773 Secondary and unspecified malignant neoplasm of axilla and upper limb lymph nodes: Secondary | ICD-10-CM

## 2013-03-25 DIAGNOSIS — Z170 Estrogen receptor positive status [ER+]: Secondary | ICD-10-CM

## 2013-03-25 DIAGNOSIS — J4489 Other specified chronic obstructive pulmonary disease: Secondary | ICD-10-CM | POA: Insufficient documentation

## 2013-03-25 DIAGNOSIS — Z09 Encounter for follow-up examination after completed treatment for conditions other than malignant neoplasm: Secondary | ICD-10-CM | POA: Insufficient documentation

## 2013-03-25 DIAGNOSIS — F17200 Nicotine dependence, unspecified, uncomplicated: Secondary | ICD-10-CM

## 2013-03-25 DIAGNOSIS — E8779 Other fluid overload: Secondary | ICD-10-CM

## 2013-03-25 DIAGNOSIS — J449 Chronic obstructive pulmonary disease, unspecified: Secondary | ICD-10-CM | POA: Insufficient documentation

## 2013-03-25 DIAGNOSIS — Z9221 Personal history of antineoplastic chemotherapy: Secondary | ICD-10-CM | POA: Insufficient documentation

## 2013-03-25 DIAGNOSIS — G589 Mononeuropathy, unspecified: Secondary | ICD-10-CM | POA: Insufficient documentation

## 2013-03-25 DIAGNOSIS — R609 Edema, unspecified: Secondary | ICD-10-CM

## 2013-03-25 DIAGNOSIS — G629 Polyneuropathy, unspecified: Secondary | ICD-10-CM

## 2013-03-25 DIAGNOSIS — R55 Syncope and collapse: Secondary | ICD-10-CM | POA: Insufficient documentation

## 2013-03-25 DIAGNOSIS — Z853 Personal history of malignant neoplasm of breast: Secondary | ICD-10-CM | POA: Insufficient documentation

## 2013-03-25 DIAGNOSIS — K219 Gastro-esophageal reflux disease without esophagitis: Secondary | ICD-10-CM | POA: Insufficient documentation

## 2013-03-25 LAB — LIPID PANEL
CHOL/HDL RATIO: 6.4 ratio
CHOLESTEROL: 147 mg/dL (ref 0–200)
HDL: 23 mg/dL — ABNORMAL LOW (ref >39)
LDL Cholesterol: 54 mg/dL (ref 0–99)
Triglycerides: 349 mg/dL — ABNORMAL HIGH (ref <150)
VLDL: 70 mg/dL — ABNORMAL HIGH (ref 0–40)

## 2013-03-25 LAB — CBC WITH DIFFERENTIAL/PLATELET
BASOS PCT: 1 % (ref 0–1)
Basophils Absolute: 0 10*3/uL (ref 0.0–0.1)
EOS ABS: 0.2 10*3/uL (ref 0.0–0.7)
EOS PCT: 3 % (ref 0–5)
HCT: 43 % (ref 36.0–46.0)
Hemoglobin: 14.3 g/dL (ref 12.0–15.0)
Lymphocytes Relative: 32 % (ref 12–46)
Lymphs Abs: 1.9 10*3/uL (ref 0.7–4.0)
MCH: 30.8 pg (ref 26.0–34.0)
MCHC: 33.3 g/dL (ref 30.0–36.0)
MCV: 92.7 fL (ref 78.0–100.0)
Monocytes Absolute: 0.4 10*3/uL (ref 0.1–1.0)
Monocytes Relative: 7 % (ref 3–12)
NEUTROS PCT: 57 % (ref 43–77)
Neutro Abs: 3.4 10*3/uL (ref 1.7–7.7)
PLATELETS: 194 10*3/uL (ref 150–400)
RBC: 4.64 MIL/uL (ref 3.87–5.11)
RDW: 13.2 % (ref 11.5–15.5)
WBC: 6 10*3/uL (ref 4.0–10.5)

## 2013-03-25 LAB — ESTRADIOL: ESTRADIOL: 22.9 pg/mL

## 2013-03-25 LAB — COMPREHENSIVE METABOLIC PANEL
ALBUMIN: 3.1 g/dL — AB (ref 3.5–5.2)
ALK PHOS: 98 U/L (ref 39–117)
ALT: 18 U/L (ref 0–35)
AST: 19 U/L (ref 0–37)
BUN: 10 mg/dL (ref 6–23)
CALCIUM: 9.4 mg/dL (ref 8.4–10.5)
CO2: 29 mEq/L (ref 19–32)
Chloride: 103 mEq/L (ref 96–112)
Creatinine, Ser: 0.61 mg/dL (ref 0.50–1.10)
GFR calc non Af Amer: >90 mL/min (ref >90)
Glucose, Bld: 136 mg/dL — ABNORMAL HIGH (ref 70–99)
POTASSIUM: 4.5 meq/L (ref 3.7–5.3)
SODIUM: 141 meq/L (ref 137–147)
TOTAL PROTEIN: 6.8 g/dL (ref 6.0–8.3)
Total Bilirubin: <0.2 mg/dL — ABNORMAL LOW (ref 0.3–1.2)

## 2013-03-25 LAB — CEA: CEA: 10.5 ng/mL — AB (ref 0.0–5.0)

## 2013-03-25 LAB — TSH

## 2013-03-25 LAB — LUTEINIZING HORMONE: LH: 6.7 m[IU]/mL

## 2013-03-25 LAB — CANCER ANTIGEN 27.29: CA 27.29: 26 U/mL (ref 0–39)

## 2013-03-25 LAB — FOLLICLE STIMULATING HORMONE: FSH: 17.5 m[IU]/mL

## 2013-03-25 MED ORDER — FUROSEMIDE 20 MG PO TABS: ORAL_TABLET | ORAL | Status: DC

## 2013-03-25 MED ORDER — GABAPENTIN 300 MG PO CAPS: ORAL_CAPSULE | ORAL | Status: DC

## 2013-03-25 NOTE — Progress Notes (Signed)
Sheila Jefferson  OFFICE PROGRESS NOTE  Sheila Percy, MD [address] [place] [address]  DIAGNOSIS: Cancer of upper-outer quadrant of female breast - Plan: Luteinizing hormone, Follicle stimulating hormone, Estradiol, CBC with Differential, Comprehensive metabolic panel, CEA, Cancer antigen 27.29, Luteinizing hormone, Follicle stimulating hormone, Estradiol  Peripheral neuropathy - Plan: Lipid panel, Lipid panel  Edema - Plan: furosemide (LASIX) 20 MG tablet, TSH, Lipid panel, Lipid panel, TSH  Chief Complaint  Patient presents with  . Breast Cancer    CURRENT THERAPY: Tamoxifen 20 mg daily.  INTERVAL HISTORY: Sheila Jefferson 48 y.o. female returns for followup of stage II right breast cancer, status post surgery, chemotherapy, and tamoxifen started on 10/21/2011.  She is suffering from generalized paresthesias and has difficulty standing for long periods of time before legs give out. She is taking gabapentin 900 mg at bedtime. She still has hot flashes but is not taking any Effexor because she did not like the way she felt while taking the drug. She denies a diarrhea constipation and has not menstruated in over to a year and half. She denies any dysuria, hematuria, melena, hematochezia, hematuria, epistaxis, hemoptysis, chest pain, PND, orthopnea, but does have lower extremity swelling which apparently is getting worse. Mammogram is scheduled for next week.  MEDICAL HISTORY: Past Medical History  Diagnosis Date  . GERD (gastroesophageal reflux disease)   . Bulging lumbar disc   . Cancer of upper-outer quadrant of female breast 01/06/2011    right breast    INTERIM HISTORY: has Cancer of upper-outer quadrant of female breast on her problem list.   Stage II, grade 2, infiltrating ductal carcinoma the right breast, 2.5 cm in size with one positive node out of 15 and one additional high axillary node given her total 16 nodes. HER-2/neu negative, ER  +90%, PR +80%, Ki-67 marker high at 71%, status post dose dense Adriamycin and Cytoxan for 4 cycles followed by Taxotere for 4 cycles. She is now on Tamoxifen 20 mg daily beginning on 10/21/2011. Severe vasomotor instability started on Effexor 75 mg daily on 09/18/2012. ALLERGIES:  has No Known Allergies.  MEDICATIONS: has a current medication list which includes the following prescription(s): alprazolam, esomeprazole, furosemide, gabapentin, ibuprofen, potassium chloride sa, tamoxifen, oxycodone-acetaminophen, and venlafaxine xr.  SURGICAL HISTORY:  Past Surgical History  Procedure Laterality Date  . Breast reduction surgery  1997  . Cholecystectomy  2003  . Tubal ligation  2003  . Pilonidal cyst      1980-81-83  . Colonoscopy    . Portacath placement  02/11/2011    Procedure: INSERTION PORT-A-CATH;  Surgeon: Haywood Lasso, MD;  Location: [facility];  Service: General;  Laterality: Left;  INSERTION PORT-A-CATH LEFT SUBCLAVIAN  . Breast lumpectomy  02/11/2011    right breast and axillary dissection  . Uterine fibroid surgery  09/30/2011    removal 8 polyps - Dr Derenda Mis  . Port-a-cath removal  11/10/2011    Procedure: MINOR REMOVAL PORT-A-CATH;  Surgeon: Haywood Lasso, MD;  Location: [facility];  Service: General;  Laterality: Left;    FAMILY HISTORY: family history includes Cancer in her maternal aunt, mother, and sister; Diabetes in her father.  SOCIAL HISTORY:  reports that she has been smoking Cigarettes.  She has a 15 pack-year smoking history. She has never used smokeless tobacco. She reports that she uses illicit drugs (Hydrocodone). She reports that she does not drink alcohol.  REVIEW  OF SYSTEMS:  Other than that discussed above is noncontributory.  PHYSICAL EXAMINATION: ECOG PERFORMANCE STATUS: 1 - Symptomatic but completely ambulatory  Blood pressure 123/85, pulse 103, temperature 98 F (36.7 C), temperature source Oral, resp. rate 16, weight 197 lb 1.6 oz  (89.404 kg), last menstrual period 01/06/2011.  GENERAL:alert, no distress and comfortable SKIN: skin color, texture, turgor are normal, no rashes or significant lesions EYES: PERLA; Conjunctiva are pink and non-injected, sclera clear SINUSES: No redness or tenderness over maxillary or ethmoid sinuses OROPHARYNX:no exudate, no erythema on lips, buccal mucosa, or tongue. NECK: supple, thyroid normal size, non-tender, without nodularity. No masses CHEST: Status post right breast lumpectomy with no masses in either breast. LYMPH:  no palpable lymphadenopathy in the cervical, axillary or inguinal LUNGS: clear to auscultation and percussion with normal breathing effort HEART: regular rate & rhythm and no murmurs. ABDOMEN:abdomen soft, non-tender and normal bowel sounds MUSCULOSKELETAL:no cyanosis of digits and no clubbing. Range of motion normal.  NEURO: alert & oriented x 3 with fluent speech, no focal motor/sensory deficits   LABORATORY DATA: Office Visit on 03/25/2013  Component Date Value Ref Range Status  . WBC 03/25/2013 6.0  4.0 - 10.5 K/uL Final  . RBC 03/25/2013 4.64  3.87 - 5.11 MIL/uL Final  . Hemoglobin 03/25/2013 14.3  12.0 - 15.0 g/dL Final  . HCT 03/25/2013 43.0  36.0 - 46.0 % Final  . MCV 03/25/2013 92.7  78.0 - 100.0 fL Final  . MCH 03/25/2013 30.8  26.0 - 34.0 pg Final  . MCHC 03/25/2013 33.3  30.0 - 36.0 g/dL Final  . RDW 03/25/2013 13.2  11.5 - 15.5 % Final  . Platelets 03/25/2013 194  150 - 400 K/uL Final  . Neutrophils Relative % 03/25/2013 57  43 - 77 % Final  . Neutro Abs 03/25/2013 3.4  1.7 - 7.7 K/uL Final  . Lymphocytes Relative 03/25/2013 32  12 - 46 % Final  . Lymphs Abs 03/25/2013 1.9  0.7 - 4.0 K/uL Final  . Monocytes Relative 03/25/2013 7  3 - 12 % Final  . Monocytes Absolute 03/25/2013 0.4  0.1 - 1.0 K/uL Final  . Eosinophils Relative 03/25/2013 3  0 - 5 % Final  . Eosinophils Absolute 03/25/2013 0.2  0.0 - 0.7 K/uL Final  . Basophils Relative  03/25/2013 1  0 - 1 % Final  . Basophils Absolute 03/25/2013 0.0  0.0 - 0.1 K/uL Final  . Sodium 03/25/2013 141  137 - 147 mEq/L Final  . Potassium 03/25/2013 4.5  3.7 - 5.3 mEq/L Final  . Chloride 03/25/2013 103  96 - 112 mEq/L Final  . CO2 03/25/2013 29  19 - 32 mEq/L Final  . Glucose, Bld 03/25/2013 136* 70 - 99 mg/dL Final  . BUN 03/25/2013 10  6 - 23 mg/dL Final  . Creatinine, Ser 03/25/2013 0.61  0.50 - 1.10 mg/dL Final  . Calcium 03/25/2013 9.4  8.4 - 10.5 mg/dL Final  . Total Protein 03/25/2013 6.8  6.0 - 8.3 g/dL Final  . Albumin 03/25/2013 3.1* 3.5 - 5.2 g/dL Final  . AST 03/25/2013 19  0 - 37 U/L Final  . ALT 03/25/2013 18  0 - 35 U/L Final  . Alkaline Phosphatase 03/25/2013 98  39 - 117 U/L Final  . Total Bilirubin 03/25/2013 <0.2* 0.3 - 1.2 mg/dL Final  . GFR calc non Af Amer 03/25/2013 >90  >90 mL/min Final  . GFR calc Af Amer 03/25/2013 >90  >90 mL/min Final  Comment: (NOTE)                          The eGFR has been calculated using the CKD EPI equation.                          This calculation has not been validated in all clinical situations.                          eGFR's persistently <90 mL/min signify possible Chronic Kidney                          Disease.  . Cholesterol 03/25/2013 147  0 - 200 mg/dL Final  . Triglycerides 03/25/2013 349* <150 mg/dL Final  . HDL 03/25/2013 23* >39 mg/dL Final  . Total CHOL/HDL Ratio 03/25/2013 6.4   Final  . VLDL 03/25/2013 70* 0 - 40 mg/dL Final  . LDL Cholesterol 03/25/2013 54  0 - 99 mg/dL Final   Comment:                                 Total Cholesterol/HDL:CHD Risk                          Coronary Heart Disease Risk Table                                              Men   Women                           1/2 Average Risk   3.4   3.3                           Average Risk       5.0   4.4                           2 X Average Risk   9.6   7.1                           3 X Average Risk  23.4   11.0                                                           Use the calculated Patient Ratio                          above and the CHD Risk Table                          to determine the patient's CHD Risk.  ATP III CLASSIFICATION (LDL):                           <100     mg/dL   Optimal                           100-129  mg/dL   Near or Above                                             Optimal                           130-159  mg/dL   Borderline                           160-189  mg/dL   High                           >190     mg/dL   Very High    PATHOLOGY: No new pathology.  Urinalysis No results found for this basename: colorurine,  appearanceur,  labspec,  phurine,  glucoseu,  hgbur,  bilirubinur,  ketonesur,  proteinur,  urobilinogen,  nitrite,  leukocytesur    RADIOGRAPHIC STUDIES:  MM Digital Diagnostic Unilat R Status: Final result            Study Result    *RADIOLOGY REPORT*  Clinical Data: Patient for follow-up of probably benign  calcifications within the lumpectomy site right breast.  DIGITAL DIAGNOSTIC RIGHT MAMMOGRAM WITH CAD  Comparison: [facility] including 03/21/2012.  Findings:  ACR Breast Density Category b: There are scattered areas of  fibroglandular density.  Post lumpectomy changes are demonstrated within the upper-outer  quadrant of the right breast. Sheet like calcifications at the  lumpectomy site are redemonstrated and grossly stable when compared  to prior examination, likely dystrophic in etiology.  Mammographic images were processed with CAD.  IMPRESSION:  Grossly similar appearing post lumpectomy changes with associated  sheet-like calcifications, likely dystrophic in etiology. Recommend  followup diagnostic right breast mammogram in 6 months when the  patient is due for bilateral evaluation.  RECOMMENDATION:  Follow-up right breast diagnostic mammogram when the patient is due  for bilateral  evaluation in 6 months.  I have discussed the findings and recommendations with the patient.  Results were also provided in writing at the conclusion of the  visit. If applicable, a reminder letter will be sent to the  patient regarding her next appointment.  BI-RADS CATEGORY 3: Probably benign finding(s) - short interval  follow-up suggested   .  ASSESSMENT:  #1.Stage II, grade 2, infiltrating ductal carcinoma the right breast, 2.5 cm in size with one positive node out of 15 and one additional high axillary node given her total 16 nodes. HER-2/neu negative, ER +90%, PR +80%, Ki-67 marker high at 71%, status post dose dense Adriamycin and Cytoxan for 4 cycles followed by Taxotere for 4 cycles. She is now on Tamoxifen 20 mg daily beginning on 10/21/2011 which she will take for 5-10 years +/- switch to AI when proven to be post-menopausal. We'll do biochemical tests today to demonstrate menopausal status. #2. Symptomatic neuropathy. #3. Fluid retention secondary to gabapentin  and tamoxifen. #4. Lumbar disc disease, stable. #5. Chronic obstructive pulmonary disease, still smoking. #6. Gastroesophageal reflux disease. #7. History of uterine polyps, status post polypectomy. #8. Vasomotor instability.    PLAN:  #1. Continue tamoxifen 20 mg daily unless biochemically postmenopausal. If so, switch to aromatase inhibitor. This will help with fluid retention and also perhaps decrease vasomotor instability. #2. Increase gabapentin 300 mg 3 times a day +900 mg at bedtime. Hopefully peripheral neuropathic symptoms will improve as well as hot flashes. #3. TSH and lipid panel along with tumor markers today. #4. Followup with bilateral mammograms next week. #5. Call back in 2 weeks regarding symptomatology and advice regarding current endocrine therapy for breast cancer based on [place], LH, and estradiol levels. #6. Lasix 20 mg daily along with potassium supplements for for 5 days to control lower extremity  edema with repeat dosage should edema recur. #7. Office visit in 6 months with CBC, chem profile, CEA, and CA 27-29.   All questions were answered. The patient knows to call the clinic with any problems, questions or concerns. We can certainly see the patient much sooner if necessary.   I spent 40 minutes counseling the patient face to face. The total time spent in the appointment was 55 minutes.    Doroteo Bradford, MD 03/25/2013 10:22 AM

## 2013-03-25 NOTE — Patient Instructions (Signed)
[place] Discharge Instructions  RECOMMENDATIONS MADE BY THE CONSULTANT AND ANY TEST RESULTS WILL BE SENT TO YOUR REFERRING PHYSICIAN.  EXAM FINDINGS BY THE PHYSICIAN TODAY AND SIGNS OR SYMPTOMS TO REPORT TO CLINIC OR PRIMARY PHYSICIAN: Exam and findings as discussed by Dr. Barnet Glasgow. Report any new lumps, bone pain, shortness of breath or other symptoms.  Will check some blood work today and make a few changes in your medications. Call us in 2 weeks and let us know how you are doing.   MEDICATIONS PRESCRIBED:  Lasix - take as directed Increase gabapentin to 300 mg three times daily and 900 mg at bedtime  INSTRUCTIONS/FOLLOW-UP: Follow-up in 6 months.  Thank you for choosing [facility] to provide your oncology and hematology care.  To afford each patient quality time with our providers, please arrive at least 15 minutes before your scheduled appointment time.  With your help, our goal is to use those 15 minutes to complete the necessary work-up to ensure our physicians have the information they need to help with your evaluation and healthcare recommendations.    Effective January 1st, 2014, we ask that you re-schedule your appointment with our physicians should you arrive 10 or more minutes late for your appointment.  We strive to give you quality time with our providers, and arriving late affects you and other patients whose appointments are after yours.    Again, thank you for choosing [facility].  Our Jaclin is that these requests will decrease the amount of time that you wait before being seen by our physicians.       _____________________________________________________________  Should you have questions after your visit to [facility], please contact our office at [phone] between the hours of 8:30 a.m. and 5:00 p.m.  Voicemails left after 4:30 p.m. will not be returned until the following business day.  For  prescription refill requests, have your pharmacy contact our office with your prescription refill request.

## 2013-03-25 NOTE — Progress Notes (Signed)
Labs drawn today for cbc/diff,cmp,cea,ca2729,LH,FSH,Estradiol

## 2013-03-27 ENCOUNTER — Other Ambulatory Visit (HOSPITAL_COMMUNITY): Payer: Self-pay | Admitting: Oncology

## 2013-03-27 ENCOUNTER — Ambulatory Visit (HOSPITAL_COMMUNITY)
Admission: RE | Admit: 2013-03-27 | Discharge: 2013-03-27 | Disposition: A | Discharge status: Home / Self Care | Payer: BC Managed Care – PPO | Source: Ambulatory Visit | Attending: Oncology | Admitting: Oncology

## 2013-03-27 ENCOUNTER — Telehealth (HOSPITAL_COMMUNITY): Payer: Self-pay | Admitting: *Deleted

## 2013-03-27 DIAGNOSIS — R609 Edema, unspecified: Secondary | ICD-10-CM

## 2013-03-27 DIAGNOSIS — Z9889 Other specified postprocedural states: Secondary | ICD-10-CM

## 2013-03-27 DIAGNOSIS — Z853 Personal history of malignant neoplasm of breast: Secondary | ICD-10-CM | POA: Insufficient documentation

## 2013-03-27 MED ORDER — POTASSIUM CHLORIDE CRYS ER 20 MEQ PO TBCR: EXTENDED_RELEASE_TABLET | ORAL | Status: DC

## 2013-03-29 ENCOUNTER — Telehealth (HOSPITAL_COMMUNITY): Payer: Self-pay

## 2013-03-29 ENCOUNTER — Other Ambulatory Visit (HOSPITAL_COMMUNITY): Payer: Self-pay | Admitting: Oncology

## 2013-03-29 DIAGNOSIS — R600 Localized edema: Secondary | ICD-10-CM

## 2013-03-29 MED ORDER — SPIRONOLACTONE 100 MG PO TABS: 100.0000 mg | ORAL_TABLET | Freq: Every day | ORAL | Status: DC

## 2013-03-29 NOTE — Telephone Encounter (Signed)
Message copied by Mellissa Kohut on Fri Mar 29, 2013 10:36 AM ------      Message from: [facility]      Created: Fri Mar 29, 2013  8:58 AM       Patient called complaining of worsening LE edema.  Dr. Wanda Plump put her on Lasix for a few days.  I will add Spironolactone for 10 days to help with swelling.  She can take this in addition to Lasix.  She should also elevate her feet.  I escribed it to her pharmacy. ------

## 2013-03-29 NOTE — Telephone Encounter (Signed)
Patient instructed as below.  States "Ever since he increased the gabapentin, it seems like my swelling is worse."  Instructed to call back with an update on Monday.

## 2013-04-08 ENCOUNTER — Telehealth (HOSPITAL_COMMUNITY): Payer: Self-pay | Admitting: *Deleted

## 2013-04-08 ENCOUNTER — Other Ambulatory Visit (HOSPITAL_COMMUNITY): Payer: Self-pay | Admitting: Oncology

## 2013-04-08 DIAGNOSIS — R600 Localized edema: Secondary | ICD-10-CM

## 2013-04-08 MED ORDER — SPIRONOLACTONE 100 MG PO TABS: 100.0000 mg | ORAL_TABLET | Freq: Every day | ORAL | Status: AC

## 2013-04-08 NOTE — Telephone Encounter (Signed)
Sheila Jefferson called and said the Aldactone 100 mg was working and could you call some more in at [facility] in [place] please.

## 2013-04-08 NOTE — Telephone Encounter (Signed)
Sheila Jefferson called and said that the Aldactone was working and she took her last one this morning and could you call her in some more to [facility] in [place].Marland KitchenMarland Kitchen

## 2013-04-08 NOTE — Telephone Encounter (Signed)
Error

## 2013-05-01 ENCOUNTER — Encounter: Payer: Self-pay | Admitting: Gynecology

## 2013-09-22 NOTE — Progress Notes (Signed)
-  Rescheduled-  Sheila Jefferson  

## 2013-09-23 ENCOUNTER — Ambulatory Visit (HOSPITAL_COMMUNITY): Payer: BC Managed Care – PPO | Admitting: Oncology

## 2013-10-03 NOTE — Progress Notes (Signed)
[name], MD [place] [address]  Cancer of upper-outer quadrant of female breast, right  CURRENT THERAPY: Tamoxifen 20 mg daily and surveillance per NCCN guidelines  INTERVAL HISTORY: Sheila Jefferson 48 y.o. female returns for  regular  visit for followup of Stage II, grade 2, infiltrating ductal carcinoma the right breast, 2.5 cm in size with one positive node out of 15 and one additional high axillary node given her total 16 nodes. HER-2/neu negative, ER +90%, PR +80%, Ki-67 marker high at 71%, status post dose dense Adriamycin and Cytoxan for 4 cycles followed by Taxotere for 4 cycles. She is now on Tamoxifen 20 mg daily beginning on 10/21/2011.   I personally reviewed and went over laboratory results with the patient.  The results are noted within this dictation.  I personally reviewed and went over radiographic studies with the patient.  The results are noted within this dictation.  Mammogram on 3/25 was BIRADS 2.  She will be due again in March 2016.  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  She is enjoying this job.  She looks wonderful today, better than I have ever seen her.  She reports that she feels well.  She is working with Dr. Nadara Mustard to control her TSH and that seems to be on the right track according to her lab results she provides me today.    Due to her new job, she is having more right lymphadenopathy and she is clear that she does not want to be seen at the lymphedema clinic again.  We reviewed some tips to minimize lymphedema.  She does not want to use the compression sleeve due to side effects of Tamoxifen hot flashes.  Oncologically, she denies any complaints and ROS questioning is negative.  Past Medical History  Diagnosis Date  . GERD (gastroesophageal reflux disease)   . Bulging lumbar disc   . Cancer of upper-outer quadrant of female breast 01/06/2011    right breast    has Cancer of  upper-outer quadrant of female breast, right on her problem list.     has No Known Allergies.  Ms. Klich had no medications administered during this visit.  Past Surgical History  Procedure Laterality Date  . Breast reduction surgery  1997  . Cholecystectomy  2003  . Tubal ligation  2003  . Pilonidal cyst      1980-81-83  . Colonoscopy    . Portacath placement  02/11/2011    Procedure: INSERTION PORT-A-CATH;  Surgeon: Haywood Lasso, MD;  Location: [facility];  Service: General;  Laterality: Left;  INSERTION PORT-A-CATH LEFT SUBCLAVIAN  . Breast lumpectomy  02/11/2011    right breast and axillary dissection  . Uterine fibroid surgery  09/30/2011    removal 8 polyps - Dr Derenda Mis  . Port-a-cath removal  11/10/2011    Procedure: MINOR REMOVAL PORT-A-CATH;  Surgeon: Haywood Lasso, MD;  Location: [facility];  Service: General;  Laterality: Left;    Denies any headaches, dizziness, double vision, fevers, chills, night sweats, nausea, vomiting, diarrhea, constipation, chest pain, heart palpitations, shortness of breath, blood in stool, black tarry stool, urinary pain, urinary burning, urinary frequency, hematuria.   PHYSICAL EXAMINATION  ECOG PERFORMANCE STATUS: 1 - Symptomatic but completely ambulatory  Filed Vitals:   10/08/13 1500  BP: 123/87  Pulse: 101  Temp: 98.4 F (36.9 C)  Resp: 20    GENERAL:alert, no  distress, well nourished, well developed, comfortable, cooperative and smiling SKIN: skin color, texture, turgor are normal, no rashes or significant lesions HEAD: Normocephalic, No masses, lesions, tenderness or abnormalities EYES: normal, PERRLA, EOMI, Conjunctiva are pink and non-injected EARS: External ears normal OROPHARYNX:lips, buccal mucosa, and tongue normal and mucous membranes are moist  NECK: supple, no adenopathy, thyroid normal size, non-tender, without nodularity, no stridor, non-tender, trachea midline LYMPH:  no palpable lymphadenopathy, no  hepatosplenomegaly BREAST:unchanged from previous exams LUNGS: clear to auscultation and percussion HEART: regular rate & rhythm, no murmurs, no gallops, S1 normal and S2 normal ABDOMEN:abdomen soft, non-tender, obese, normal bowel sounds, no masses or organomegaly and no hepatosplenomegaly BACK: Back symmetric, no curvature., No CVA tenderness EXTREMITIES:less then 2 second capillary refill, no joint deformities, effusion, or inflammation, no edema, no skin discoloration, no clubbing, no cyanosis  NEURO: alert & oriented x 3 with fluent speech, no focal motor/sensory deficits, gait normal    LABORATORY DATA: CBC    Component Value Date/Time   WBC 6.0 03/25/2013 0921   RBC 4.64 03/25/2013 0921   HGB 14.3 03/25/2013 0921   HCT 43.0 03/25/2013 0921   PLT 194 03/25/2013 0921   MCV 92.7 03/25/2013 0921   MCH 30.8 03/25/2013 0921   MCHC 33.3 03/25/2013 0921   RDW 13.2 03/25/2013 0921   LYMPHSABS 1.9 03/25/2013 0921   MONOABS 0.4 03/25/2013 0921   EOSABS 0.2 03/25/2013 0921   BASOSABS 0.0 03/25/2013 0921      Chemistry      Component Value Date/Time   NA 141 03/25/2013 0921   K 4.5 03/25/2013 0921   CL 103 03/25/2013 0921   CO2 29 03/25/2013 0921   BUN 10 03/25/2013 0921   CREATININE 0.61 03/25/2013 0921      Component Value Date/Time   CALCIUM 9.4 03/25/2013 0921   ALKPHOS 98 03/25/2013 0921   AST 19 03/25/2013 0921   ALT 18 03/25/2013 0921   BILITOT <0.2* 03/25/2013 0921        RADIOGRAPHIC STUDIES:  03/27/2013  CLINICAL DATA: Personal history of right breast cancer status post  lumpectomy 2013.  EXAM:  DIGITAL DIAGNOSTIC bilateral MAMMOGRAM WITH CAD  COMPARISON: September 26, 2012, March 21, 2012, January 26, 2011,  January 05, 2011  ACR BREAST DENSITY:  ACR Breast Density Category b: There are scattered areas of  fibroglandular density.  FINDINGS:  CC and MLO views of bilateral breasts, spot tangential view of right  breast are submitted. No suspicious abnormalities identified    bilaterally. Postsurgical scar with associated dystrophic  calcifications are identified in the upper posterior right breast.  Mammographic images were processed with CAD.  IMPRESSION:  Benign findings.  RECOMMENDATION:  Bilateral diagnostic mammogram in 1 year.  I have discussed the findings and recommendations with the patient.  Results were also provided in writing at the conclusion of the  visit. If applicable, a reminder letter will be sent to the patient  regarding the next appointment.  BI-RADS CATEGORY 2: Benign finding(s).  Electronically Signed  By: Abelardo Diesel M.D.  On: 03/27/2013 09:18    ASSESSMENT:  1. Stage II, grade 2, infiltrating ductal carcinoma the right breast, 2.5 cm in size with one positive node out of 15 and one additional high axillary node given her total 16 nodes. HER-2/neu negative, ER +90%, PR +80%, Ki-67 marker high at 71%, status post dose dense Adriamycin and Cytoxan for 4 cycles followed by Taxotere for 4 cycles. She is now on Tamoxifen 20 mg daily  beginning on 10/21/2011 which she will take for 5-10 years +/- switch to AI when proven to be post-menopausal. 2. Symptomatic neuropathy, grade 1.  3. Fluid retention secondary to gabapentin and tamoxifen.  4. Lumbar disc disease, stable.  5. Chronic obstructive pulmonary disease, still smoking.  6. Gastroesophageal reflux disease.  7. History of uterine polyps, status post polypectomy.  8. Vasomotor instability. 9. Tamoxifen-induced hot flashes  Patient Active Problem List   Diagnosis Date Noted  . Cancer of upper-outer quadrant of female breast, right 01/06/2011     PLAN:  1. I personally reviewed and went over laboratory results with the patient.  The results are noted within this dictation. 2. I personally reviewed and went over radiographic studies with the patient.  The results are noted within this dictation.   3. Next screening mammogram is due in March 2016. 4. Continue Tamoxifen  daily. 5. Labs reviewed 6. Return in 6 months for follow-up, at which time hormone levels can be repeated to see if patient is post-menopausal and a candidate for AI switch.   THERAPY PLAN:  Continue Tamoxifen daily and we will continue to follow NCCN guidelines.  NCCN guidelines recommends the following surveillance for invasive breast cancer:  A. History and Physical exam every 4-6 months for 5 years and then every 12 months.  B. Mammography every 12 months  C. Women on Tamoxifen: annual gynecologic assessment every 12 months if uterus is present.  D. Women on aromatase inhibitor or who experience ovarian failure secondary to treatment should have monitoring of bone health with a bone mineral density determination at baseline and periodically thereafter.  E. Assess and encourage adherence to adjuvant endocrine therapy.  F. Evidence suggests that active lifestyle and achieving and maintaining an ideal body weight (20-25 BMI) may lead to optimal breast cancer outcomes.  NCCN guidelines recommends monitoring for the following for those patients on Tamoxifen/Raloxifene Therapy:  A. Asymptomatic   1. Continue risk-reduction agent   2. Continue follow-up  B. Hot-flashes or other risk-reduction, agent related symptoms   1. Symptomatic treatment.    2. If persists, re-evaluate role of risk-reduction agent   3. Continue risk-reduction agent- Continue follow-up  C. Abnormal vaginal bleeding   1. Prompt evaluation for endometrial cancer if uterus intact    A. If endometrial pathology found, re-initiation of Tamoxifen may be considered after hysterectomy if early-stage disease     1. Continue follow-up    B. If no endometrial pathology (carcinoma or hyperplasia with or without atypia) found, continue Tamoxifen and re-evaluate if symptoms persist or recur.     1. Continue follow-up  D. Anticipated elective surgery   1. Consider discontinuing Tamoxifen or Raloxifene prior to elective surgery   2.  Resume Tamoxifen or Raloxifene postoperatively when ambulation is normal  E. Deep vein thrombosis, pulmonary embolism, cerebrovascular accident, or prolonged immobilization   1. Discontinue tamoxifen or raloxifene, treat underlying condition.   All questions were answered. The patient knows to call the clinic with any problems, questions or concerns. We can certainly see the patient much sooner if necessary.  Patient and plan discussed with Dr. Nelida Meuse and he is in agreement with the aforementioned.   KEFALAS,THOMAS 10/08/2013

## 2013-10-08 ENCOUNTER — Encounter (HOSPITAL_COMMUNITY): Payer: Self-pay | Admitting: Oncology

## 2013-10-08 ENCOUNTER — Encounter (HOSPITAL_COMMUNITY): Payer: BC Managed Care – PPO | Attending: Oncology | Admitting: Oncology

## 2013-10-08 VITALS — BP 123/87 | HR 101 | Temp 98.4°F | Resp 20 | Wt 190.0 lb

## 2013-10-08 DIAGNOSIS — C50411 Malignant neoplasm of upper-outer quadrant of right female breast: Secondary | ICD-10-CM

## 2013-10-08 DIAGNOSIS — Z853 Personal history of malignant neoplasm of breast: Secondary | ICD-10-CM

## 2013-10-08 DIAGNOSIS — Z79810 Long term (current) use of selective estrogen receptor modulators (SERMs): Secondary | ICD-10-CM

## 2013-10-08 DIAGNOSIS — R599 Enlarged lymph nodes, unspecified: Secondary | ICD-10-CM

## 2013-10-08 NOTE — Patient Instructions (Signed)
[facility] Discharge Instructions  RECOMMENDATIONS MADE BY THE CONSULTANT AND ANY TEST RESULTS WILL BE SENT TO YOUR REFERRING PHYSICIAN.  EXAM FINDINGS BY THE PHYSICIAN TODAY AND SIGNS OR SYMPTOMS TO REPORT TO CLINIC OR PRIMARY PHYSICIAN: Exam and findings as discussed by Meriel Flavors.  MEDICATIONS PRESCRIBED:  Continue Tamoxifen as prescribed  INSTRUCTIONS/FOLLOW-UP: Return in 6 months for follow up. Report any changes/increase in lymphedema as needed.  Thank you for choosing [facility] to provide your oncology and hematology care.  To afford each patient quality time with our providers, please arrive at least 15 minutes before your scheduled appointment time.  With your help, our goal is to use those 15 minutes to complete the necessary work-up to ensure our physicians have the information they need to help with your evaluation and healthcare recommendations.    Effective January 1st, 2014, we ask that you re-schedule your appointment with our physicians should you arrive 10 or more minutes late for your appointment.  We strive to give you quality time with our providers, and arriving late affects you and other patients whose appointments are after yours.    Again, thank you for choosing [facility].  Our Shalini is that these requests will decrease the amount of time that you wait before being seen by our physicians.       _____________________________________________________________  Should you have questions after your visit to [facility], please contact our office at [phone] between the hours of 8:30 a.m. and 4:30 p.m.  Voicemails left after 4:30 p.m. will not be returned until the following business day.  For prescription refill requests, have your pharmacy contact our office with your prescription refill request.    _______________________________________________________________  We Fina that we have given you very  good care.  You may receive a patient satisfaction survey in the mail, please complete it and return it as soon as possible.  We value your feedback!  _______________________________________________________________  Have you asked about our STAR program?  STAR stands for Survivorship Training and Rehabilitation, and this is a nationally recognized cancer care program that focuses on survivorship and rehabilitation.  Cancer and cancer treatments may cause problems, such as, pain, making you feel tired and keeping you from doing the things that you need or want to do. Cancer rehabilitation can help. Our goal is to reduce these troubling effects and help you have the best quality of life possible.  You may receive a survey from a nurse that asks questions about your current state of health.  Based on the survey results, all eligible patients will be referred to the [facility] program for an evaluation so we can better serve you!  A frequently asked questions sheet is available upon request.

## 2013-10-21 ENCOUNTER — Other Ambulatory Visit (HOSPITAL_COMMUNITY): Payer: Self-pay | Admitting: "Endocrinology

## 2013-10-21 DIAGNOSIS — E0590 Thyrotoxicosis, unspecified without thyrotoxic crisis or storm: Secondary | ICD-10-CM

## 2013-10-28 ENCOUNTER — Encounter (HOSPITAL_COMMUNITY): Payer: Self-pay

## 2013-10-28 ENCOUNTER — Encounter (HOSPITAL_COMMUNITY)
Admission: RE | Admit: 2013-10-28 | Discharge: 2013-10-28 | Disposition: A | Discharge status: Home / Self Care | Payer: BC Managed Care – PPO | Source: Ambulatory Visit | Attending: Diagnostic Radiology | Admitting: Diagnostic Radiology

## 2013-10-28 DIAGNOSIS — E0590 Thyrotoxicosis, unspecified without thyrotoxic crisis or storm: Secondary | ICD-10-CM | POA: Insufficient documentation

## 2013-10-28 MED ORDER — SODIUM IODIDE I 131 CAPSULE
10.0000 | Freq: Once | INTRAVENOUS | Status: AC | PRN
  Administered 2013-10-28: 10 via ORAL

## 2013-10-29 ENCOUNTER — Encounter (HOSPITAL_COMMUNITY)
Admission: RE | Admit: 2013-10-29 | Discharge: 2013-10-29 | Disposition: A | Discharge status: Home / Self Care | Payer: BC Managed Care – PPO | Source: Ambulatory Visit | Attending: "Endocrinology | Admitting: "Endocrinology

## 2013-10-29 ENCOUNTER — Encounter (HOSPITAL_COMMUNITY): Payer: Self-pay

## 2013-10-29 DIAGNOSIS — E0590 Thyrotoxicosis, unspecified without thyrotoxic crisis or storm: Secondary | ICD-10-CM | POA: Diagnosis not present

## 2013-10-29 MED ORDER — SODIUM PERTECHNETATE TC 99M INJECTION
10.0000 | Freq: Once | INTRAVENOUS | Status: AC | PRN
  Administered 2013-10-29: 10 via INTRAVENOUS

## 2013-11-04 ENCOUNTER — Other Ambulatory Visit (HOSPITAL_COMMUNITY): Payer: Self-pay | Admitting: "Endocrinology

## 2013-11-04 DIAGNOSIS — E0590 Thyrotoxicosis, unspecified without thyrotoxic crisis or storm: Secondary | ICD-10-CM

## 2013-11-08 ENCOUNTER — Encounter (HOSPITAL_COMMUNITY)
Admission: RE | Admit: 2013-11-08 | Discharge: 2013-11-08 | Disposition: A | Discharge status: Home / Self Care | Payer: BC Managed Care – PPO | Source: Ambulatory Visit | Attending: Diagnostic Radiology | Admitting: Diagnostic Radiology

## 2013-11-08 ENCOUNTER — Encounter (HOSPITAL_COMMUNITY): Payer: Self-pay

## 2013-11-08 DIAGNOSIS — E0590 Thyrotoxicosis, unspecified without thyrotoxic crisis or storm: Secondary | ICD-10-CM

## 2013-11-08 MED ORDER — SODIUM IODIDE I 131 CAPSULE
15.0000 | Freq: Once | INTRAVENOUS | Status: AC | PRN
  Administered 2013-11-08: 15 via ORAL

## 2014-02-28 ENCOUNTER — Telehealth (HOSPITAL_COMMUNITY): Payer: Self-pay

## 2014-02-28 NOTE — Telephone Encounter (Signed)
Call from patient.  States "[facility] has submitted several refill requests for my tamoxifen and so far has not been done. My insurance changes 03/04/14 and if at all possible I need the refills submitted on 2/19, Monday."

## 2014-03-03 ENCOUNTER — Other Ambulatory Visit (HOSPITAL_COMMUNITY): Payer: Self-pay | Admitting: Oncology

## 2014-03-03 DIAGNOSIS — C50411 Malignant neoplasm of upper-outer quadrant of right female breast: Secondary | ICD-10-CM

## 2014-03-03 MED ORDER — TAMOXIFEN CITRATE 20 MG PO TABS: 20.0000 mg | ORAL_TABLET | Freq: Every day | ORAL | Status: DC

## 2014-03-03 NOTE — Telephone Encounter (Signed)
Patient notified that Tamoxifen prescription was e-scribed to [facility] pharmacy.

## 2014-03-03 NOTE — Telephone Encounter (Signed)
I never received any requests from [facility], however, as of today, refills have been sent.  Sheila Jefferson 03/03/2014

## 2014-04-08 ENCOUNTER — Ambulatory Visit (HOSPITAL_COMMUNITY): Payer: BC Managed Care – PPO | Admitting: Hematology & Oncology

## 2014-04-08 ENCOUNTER — Encounter (HOSPITAL_COMMUNITY): Payer: Self-pay | Admitting: Hematology & Oncology

## 2014-04-08 ENCOUNTER — Encounter (HOSPITAL_COMMUNITY): Payer: BLUE CROSS/BLUE SHIELD | Attending: Hematology & Oncology | Admitting: Hematology & Oncology

## 2014-04-08 VITALS — BP 123/85 | HR 96 | Temp 98.4°F | Resp 16 | Wt 202.0 lb

## 2014-04-08 DIAGNOSIS — C50411 Malignant neoplasm of upper-outer quadrant of right female breast: Secondary | ICD-10-CM | POA: Insufficient documentation

## 2014-04-08 DIAGNOSIS — C50911 Malignant neoplasm of unspecified site of right female breast: Secondary | ICD-10-CM

## 2014-04-08 DIAGNOSIS — N951 Menopausal and female climacteric states: Secondary | ICD-10-CM | POA: Insufficient documentation

## 2014-04-08 DIAGNOSIS — C773 Secondary and unspecified malignant neoplasm of axilla and upper limb lymph nodes: Secondary | ICD-10-CM

## 2014-04-08 DIAGNOSIS — J449 Chronic obstructive pulmonary disease, unspecified: Secondary | ICD-10-CM | POA: Diagnosis not present

## 2014-04-08 DIAGNOSIS — Z720 Tobacco use: Secondary | ICD-10-CM

## 2014-04-08 DIAGNOSIS — R232 Flushing: Secondary | ICD-10-CM

## 2014-04-08 LAB — CBC WITH DIFFERENTIAL/PLATELET
BASOS ABS: 0 10*3/uL (ref 0.0–0.1)
Basophils Relative: 0 % (ref 0–1)
Eosinophils Absolute: 0.1 10*3/uL (ref 0.0–0.7)
Eosinophils Relative: 1 % (ref 0–5)
HCT: 43.3 % (ref 36.0–46.0)
Hemoglobin: 14.2 g/dL (ref 12.0–15.0)
Lymphocytes Relative: 29 % (ref 12–46)
Lymphs Abs: 2.4 10*3/uL (ref 0.7–4.0)
MCH: 31.2 pg (ref 26.0–34.0)
MCHC: 32.8 g/dL (ref 30.0–36.0)
MCV: 95.2 fL (ref 78.0–100.0)
Monocytes Absolute: 0.6 10*3/uL (ref 0.1–1.0)
Monocytes Relative: 7 % (ref 3–12)
NEUTROS ABS: 5.3 10*3/uL (ref 1.7–7.7)
NEUTROS PCT: 63 % (ref 43–77)
PLATELETS: 207 10*3/uL (ref 150–400)
RBC: 4.55 MIL/uL (ref 3.87–5.11)
RDW: 14 % (ref 11.5–15.5)
WBC: 8.5 10*3/uL (ref 4.0–10.5)

## 2014-04-08 LAB — COMPREHENSIVE METABOLIC PANEL
ALT: 23 U/L (ref 0–35)
AST: 27 U/L (ref 0–37)
Albumin: 3.8 g/dL (ref 3.5–5.2)
Alkaline Phosphatase: 77 U/L (ref 39–117)
Anion gap: 9 (ref 5–15)
BUN: 15 mg/dL (ref 6–23)
CO2: 28 mmol/L (ref 19–32)
CREATININE: 0.76 mg/dL (ref 0.50–1.10)
Calcium: 9 mg/dL (ref 8.4–10.5)
Chloride: 104 mmol/L (ref 96–112)
GFR calc Af Amer: >90 mL/min (ref >90)
GFR calc non Af Amer: >90 mL/min (ref >90)
Glucose, Bld: 86 mg/dL (ref 70–99)
Potassium: 4 mmol/L (ref 3.5–5.1)
SODIUM: 141 mmol/L (ref 135–145)
Total Bilirubin: 0.3 mg/dL (ref 0.3–1.2)
Total Protein: 7.3 g/dL (ref 6.0–8.3)

## 2014-04-08 NOTE — Progress Notes (Signed)
Sheila Jefferson presented for [employer]. Labs per MD order drawn via Peripheral Line 23 gauge needle inserted in left hand  Good blood return present. Procedure without incident.  Needle removed intact. Patient tolerated procedure well.

## 2014-04-08 NOTE — Patient Instructions (Signed)
[facility] at [facility] Discharge Instructions  RECOMMENDATIONS MADE BY THE CONSULTANT AND ANY TEST RESULTS WILL BE SENT TO YOUR REFERRING PHYSICIAN.  Exam and discussion by Dr. Whitney Muse. Will check labs today and will let you know if we need to make a change with your tamoxifen Report any new lumps, bone pain, shortness of breath or other symptoms.  Follow-up in 4 months with labs and office visit.  Thank you for choosing [facility] at [facility] to provide your oncology and hematology care.  To afford each patient quality time with our provider, please arrive at least 15 minutes before your scheduled appointment time.    You need to re-schedule your appointment should you arrive 10 or more minutes late.  We strive to give you quality time with our providers, and arriving late affects you and other patients whose appointments are after yours.  Also, if you no show three or more times for appointments you may be dismissed from the clinic at the providers discretion.     Again, thank you for choosing [facility].  Our Essie is that these requests will decrease the amount of time that you wait before being seen by our physicians.       _____________________________________________________________  Should you have questions after your visit to [facility], please contact our office at [phone] between the hours of 8:30 a.m. and 4:30 p.m.  Voicemails left after 4:30 p.m. will not be returned until the following business day.  For prescription refill requests, have your pharmacy contact our office.

## 2014-04-08 NOTE — Progress Notes (Signed)
[name], MD [place] [id number]  No diagnosis found.  CURRENT THERAPY: Tamoxifen 20 mg daily and surveillance per NCCN guidelines  INTERVAL HISTORY: Sheila Jefferson 49 y.o. female returns for  regular  visit for followup of Stage II, grade 2, infiltrating ductal carcinoma the right breast, 2.5 cm in size with one positive node out of 15 and one additional high axillary node given her total 16 nodes. HER-2/neu negative, ER +90%, PR +80%, Ki-67 marker high at 71%, status post dose dense Adriamycin and Cytoxan for 4 cycles followed by Taxotere for 4 cycles. She is now on Tamoxifen 20 mg daily beginning on 10/21/2011.   I personally reviewed and went over laboratory results with the patient.  The results are noted within this dictation.  I personally reviewed and went over radiographic studies with the patient.  The results are noted within this dictation.  Mammogram on 3/25 was BIRADS 2.  She will be due again in March 2016.  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  She is enjoying this job.  She looks wonderful today, better than I have ever seen her.  She reports that she feels well.  She is working with Dr. Nadara Mustard to control her TSH and that seems to be on the right track according to her lab results she provides me today.    Due to her new job, she is having more right lymphadenopathy and she is clear that she does not want to be seen at the lymphedema clinic again.  We reviewed some tips to minimize lymphedema.  She does not want to use the compression sleeve due to side effects of Tamoxifen hot flashes.  Oncologically, she denies any complaints and ROS questioning is negative.  Past Medical History  Diagnosis Date  . GERD (gastroesophageal reflux disease)   . Bulging lumbar disc   . Cancer of upper-outer quadrant of female breast 01/06/2011    right breast    has Cancer of upper-outer quadrant of female breast, right  on her problem list.     has No Known Allergies.  Ms. Buboltz does not currently have medications on file.  Past Surgical History  Procedure Laterality Date  . Breast reduction surgery  1997  . Cholecystectomy  2003  . Tubal ligation  2003  . Pilonidal cyst      1980-81-83  . Colonoscopy    . Portacath placement  02/11/2011    Procedure: INSERTION PORT-A-CATH;  Surgeon: Haywood Lasso, MD;  Location: [place];  Service: General;  Laterality: Left;  INSERTION PORT-A-CATH LEFT SUBCLAVIAN  . Breast lumpectomy  02/11/2011    right breast and axillary dissection  . Uterine fibroid surgery  09/30/2011    removal 8 polyps - Dr Derenda Mis  . Port-a-cath removal  11/10/2011    Procedure: MINOR REMOVAL PORT-A-CATH;  Surgeon: Haywood Lasso, MD;  Location: [place];  Service: General;  Laterality: Left;    Denies any headaches, dizziness, double vision, fevers, chills, night sweats, nausea, vomiting, diarrhea, constipation, chest pain, heart palpitations, shortness of breath, blood in stool, black tarry stool, urinary pain, urinary burning, urinary frequency, hematuria.   PHYSICAL EXAMINATION  ECOG PERFORMANCE STATUS: 1 - Symptomatic but completely ambulatory  There were no vitals filed for this visit.  GENERAL:alert, no distress, well nourished, well developed, comfortable, cooperative and smiling SKIN: skin color, texture, turgor are normal, no rashes or significant  lesions HEAD: Normocephalic, No masses, lesions, tenderness or abnormalities EYES: normal, PERRLA, EOMI, Conjunctiva are pink and non-injected EARS: External ears normal OROPHARYNX:lips, buccal mucosa, and tongue normal and mucous membranes are moist  NECK: supple, no adenopathy, thyroid normal size, non-tender, without nodularity, no stridor, non-tender, trachea midline LYMPH:  no palpable lymphadenopathy, no hepatosplenomegaly BREAST:unchanged from previous exams LUNGS: clear to auscultation and  percussion HEART: regular rate & rhythm, no murmurs, no gallops, S1 normal and S2 normal ABDOMEN:abdomen soft, non-tender, obese, normal bowel sounds, no masses or organomegaly and no hepatosplenomegaly BACK: Back symmetric, no curvature., No CVA tenderness EXTREMITIES:less then 2 second capillary refill, no joint deformities, effusion, or inflammation, no edema, no skin discoloration, no clubbing, no cyanosis  NEURO: alert & oriented x 3 with fluent speech, no focal motor/sensory deficits, gait normal    LABORATORY DATA: CBC    Component Value Date/Time   WBC 6.0 03/25/2013 0921   RBC 4.64 03/25/2013 0921   HGB 14.3 03/25/2013 0921   HCT 43.0 03/25/2013 0921   PLT 194 03/25/2013 0921   MCV 92.7 03/25/2013 0921   MCH 30.8 03/25/2013 0921   MCHC 33.3 03/25/2013 0921   RDW 13.2 03/25/2013 0921   LYMPHSABS 1.9 03/25/2013 0921   MONOABS 0.4 03/25/2013 0921   EOSABS 0.2 03/25/2013 0921   BASOSABS 0.0 03/25/2013 0921      Chemistry      Component Value Date/Time   NA 141 03/25/2013 0921   K 4.5 03/25/2013 0921   CL 103 03/25/2013 0921   CO2 29 03/25/2013 0921   BUN 10 03/25/2013 0921   CREATININE 0.61 03/25/2013 0921      Component Value Date/Time   CALCIUM 9.4 03/25/2013 0921   ALKPHOS 98 03/25/2013 0921   AST 19 03/25/2013 0921   ALT 18 03/25/2013 0921   BILITOT <0.2* 03/25/2013 0921        RADIOGRAPHIC STUDIES:  03/27/2013  CLINICAL DATA: Personal history of right breast cancer status post  lumpectomy 2013.  EXAM:  DIGITAL DIAGNOSTIC bilateral MAMMOGRAM WITH CAD  COMPARISON: September 26, 2012, March 21, 2012, January 26, 2011,  January 05, 2011  ACR BREAST DENSITY:  ACR Breast Density Category b: There are scattered areas of  fibroglandular density.  FINDINGS:  CC and MLO views of bilateral breasts, spot tangential view of right  breast are submitted. No suspicious abnormalities identified  bilaterally. Postsurgical scar with associated dystrophic   calcifications are identified in the upper posterior right breast.  Mammographic images were processed with CAD.  IMPRESSION:  Benign findings.  RECOMMENDATION:  Bilateral diagnostic mammogram in 1 year.  I have discussed the findings and recommendations with the patient.  Results were also provided in writing at the conclusion of the  visit. If applicable, a reminder letter will be sent to the patient  regarding the next appointment.  BI-RADS CATEGORY 2: Benign finding(s).  Electronically Signed  By: Abelardo Diesel M.D.  On: 03/27/2013 09:18    ASSESSMENT:  1. Stage II, grade 2, infiltrating ductal carcinoma the right breast, 2.5 cm in size with one positive node out of 15 and one additional high axillary node given her total 16 nodes. HER-2/neu negative, ER +90%, PR +80%, Ki-67 marker high at 71%, status post dose dense Adriamycin and Cytoxan for 4 cycles followed by Taxotere for 4 cycles. She is now on Tamoxifen 20 mg daily beginning on 10/21/2011 which she will take for 5-10 years +/- switch to AI when proven to be post-menopausal. 2. Symptomatic  neuropathy, grade 1.  3. Fluid retention secondary to gabapentin and tamoxifen.  4. Lumbar disc disease, stable.  5. Chronic obstructive pulmonary disease, still smoking.  6. Gastroesophageal reflux disease.  7. History of uterine polyps, status post polypectomy.  8. Vasomotor instability. 9. Tamoxifen-induced hot flashes  Patient Active Problem List   Diagnosis Date Noted  . Cancer of upper-outer quadrant of female breast, right 01/06/2011     PLAN:  1. I personally reviewed and went over laboratory results with the patient.  The results are noted within this dictation. 2. I personally reviewed and went over radiographic studies with the patient.  The results are noted within this dictation.   3. Next screening mammogram is due in March 2016. 4. Continue Tamoxifen daily. 5. Labs reviewed 6. Return in 6 months for follow-up, at  which time hormone levels can be repeated to see if patient is post-menopausal and a candidate for AI switch.   THERAPY PLAN:  Continue Tamoxifen daily and we will continue to follow NCCN guidelines.  NCCN guidelines recommends the following surveillance for invasive breast cancer:  A. History and Physical exam every 4-6 months for 5 years and then every 12 months.  B. Mammography every 12 months  C. Women on Tamoxifen: annual gynecologic assessment every 12 months if uterus is present.  D. Women on aromatase inhibitor or who experience ovarian failure secondary to treatment should have monitoring of bone health with a bone mineral density determination at baseline and periodically thereafter.  E. Assess and encourage adherence to adjuvant endocrine therapy.  F. Evidence suggests that active lifestyle and achieving and maintaining an ideal body weight (20-25 BMI) may lead to optimal breast cancer outcomes.  NCCN guidelines recommends monitoring for the following for those patients on Tamoxifen/Raloxifene Therapy:  A. Asymptomatic   1. Continue risk-reduction agent   2. Continue follow-up  B. Hot-flashes or other risk-reduction, agent related symptoms   1. Symptomatic treatment.    2. If persists, re-evaluate role of risk-reduction agent   3. Continue risk-reduction agent- Continue follow-up  C. Abnormal vaginal bleeding   1. Prompt evaluation for endometrial cancer if uterus intact    A. If endometrial pathology found, re-initiation of Tamoxifen may be considered after hysterectomy if early-stage disease     1. Continue follow-up    B. If no endometrial pathology (carcinoma or hyperplasia with or without atypia) found, continue Tamoxifen and re-evaluate if symptoms persist or recur.     1. Continue follow-up  D. Anticipated elective surgery   1. Consider discontinuing Tamoxifen or Raloxifene prior to elective surgery   2. Resume Tamoxifen or Raloxifene postoperatively when ambulation is  normal  E. Deep vein thrombosis, pulmonary embolism, cerebrovascular accident, or prolonged immobilization   1. Discontinue tamoxifen or raloxifene, treat underlying condition.   All questions were answered. The patient knows to call the clinic with any problems, questions or concerns. We can certainly see the patient much sooner if necessary.  Patient and plan discussed with Dr. Nelida Meuse and he is in agreement with the aforementioned.   Molli Hazard 04/08/2014

## 2014-04-09 LAB — ESTRADIOL: Estradiol: 22.7 pg/mL

## 2014-04-09 LAB — FOLLICLE STIMULATING HORMONE: FSH: 20.1 m[IU]/mL

## 2014-05-01 NOTE — Progress Notes (Signed)
Sheila Percy, MD [address] [place] [address]   DIAGNOSIS: Cancer of upper-outer quadrant of female breast, right   Staging form: Breast, AJCC 7th Edition     Clinical: Stage IIB (T2, N1, cM0) - Signed by Haywood Lasso, MD on 01/13/2011       Prognostic indicators: ER 905, PR 80%, Ki67 = 71%      Pathologic: Stage IIB (T2, N1, cM0) - Signed by Haywood Lasso, MD on 02/14/2011       Prognostic indicators: ER 905, PR 80%, Ki67 = 71%    SUMMARY OF ONCOLOGIC HISTORY:   Cancer of upper-outer quadrant of female breast, right   01/06/2011 Initial Diagnosis Cancer of upper-outer quadrant of female breast, right   01/17/2011 Initial Biopsy Right breast and axilla biopsy demonstrating invasive ductal carcinoma with metastatic disease in 1/1 lymph nodes.  ER 90%, PR 80%, Ki-67 71%, and Her2 negative by FISH.   02/11/2011 Definitive Surgery Right breast lumpectomy with right regional lymph node dissection- invasive ductal carcinoma, grade II, 2.5 cm.  No LVI.  CLear margins. 1/16 lymph nodes positive for metastatic ductal carcinoma.     03/01/2011 - 04/12/2011 Chemotherapy AC x 4 cycles   04/26/2011 - 06/21/2011 Chemotherapy Taxotere x 4 cycles   07/15/2011 - 08/26/2011 Radiation Therapy    08/29/2011 -  Chemotherapy Tamoxifen 20 mg daily     CURRENT THERAPY: Tamoxifen  INTERVAL HISTORY: Sheila Jefferson 49 y.o. female returns for follow up of Stage II, grade 2, infiltrating ductal carcinoma the right breast, 2.5 cm in size with one positive node out of 15 and one additional high axillary node given her total 16 nodes. HER-2/neu negative, ER +90%, PR +80%, Ki-67 marker high at 71%, status post dose dense Adriamycin and Cytoxan for 4 cycles followed by Taxotere for 4 cycles. She is now on Tamoxifen 20 mg daily beginning on 10/21/2011.  He continues to have problems with right upper extremity lymphedema. This is chronic. She states she has a glove and sleeve but does not always wear it. She also  has a "egg crate" which helps keep her arm elevated at night. Her weight is a major concern and she attributes this to her thyroid. She is on Synthroid. She has problems with hot flashes but states she is intolerant of Neurontin secondary to fluid retention.   She is overdue for her mammogram with her last one being in March of last year.  MEDICAL HISTORY: Past Medical History  Diagnosis Date  . GERD (gastroesophageal reflux disease)   . Bulging lumbar disc   . Cancer of upper-outer quadrant of female breast 01/06/2011    right breast    has Cancer of upper-outer quadrant of female breast, right on her problem list.     has No Known Allergies.  Ms. Humm had no medications administered during this visit.   SURGICAL HISTORY: Past Surgical History  Procedure Laterality Date  . Breast reduction surgery  1997  . Cholecystectomy  2003  . Tubal ligation  2003  . Pilonidal cyst      1980-81-83  . Colonoscopy    . Portacath placement  02/11/2011    Procedure: INSERTION PORT-A-CATH;  Surgeon: Haywood Lasso, MD;  Location: [facility];  Service: General;  Laterality: Left;  INSERTION PORT-A-CATH LEFT SUBCLAVIAN  . Breast lumpectomy  02/11/2011    right breast and axillary dissection  . Uterine fibroid surgery  09/30/2011    removal 8 polyps -  Dr Derenda Mis  . Port-a-cath removal  11/10/2011    Procedure: MINOR REMOVAL PORT-A-CATH;  Surgeon: Haywood Lasso, MD;  Location: [facility];  Service: General;  Laterality: Left;    SOCIAL HISTORY: History   Social History  . Marital Status: Married    Spouse Name: N/A  . Number of Children: N/A  . Years of Education: N/A   Occupational History  . Not on file.   Social History Main Topics  . Smoking status: Current Every Day Smoker -- 0.50 packs/day for 30 years    Types: Cigarettes  . Smokeless tobacco: Never Used  . Alcohol Use: No  . Drug Use: Yes    Special: Hydrocodone  . Sexual Activity: Yes   Other Topics  Concern  . Not on file   Social History Narrative    FAMILY HISTORY: Family History  Problem Relation Age of Onset  . Cancer Maternal Aunt     breast  . Cancer Mother     vaginal  . Diabetes Father   . Cancer Sister     histocytosis    Review of Systems  Constitutional: Positive for malaise/fatigue. Negative for fever, chills and weight loss.       Hot flashes  HENT: Negative for congestion, hearing loss, nosebleeds, sore throat and tinnitus.   Eyes: Negative for blurred vision, double vision, pain and discharge.  Respiratory: Negative for cough, hemoptysis, sputum production, shortness of breath and wheezing.   Cardiovascular: Negative for chest pain, palpitations, claudication, leg swelling and PND.  Gastrointestinal: Negative for heartburn, nausea, vomiting, abdominal pain, diarrhea, constipation, blood in stool and melena.  Genitourinary: Positive for frequency. Negative for dysuria, urgency and hematuria.  Musculoskeletal: Negative for myalgias, joint pain and falls.       RUE lymphedema  Skin: Negative for itching and rash.  Neurological: Negative for dizziness, tingling, tremors, sensory change, speech change, focal weakness, seizures, loss of consciousness, weakness and headaches.  Endo/Heme/Allergies: Does not bruise/bleed easily.  Psychiatric/Behavioral: Negative for depression, suicidal ideas, memory loss and substance abuse. The patient is not nervous/anxious and does not have insomnia.     PHYSICAL EXAMINATION  ECOG PERFORMANCE STATUS: 1 - Symptomatic but completely ambulatory  Filed Vitals:   04/08/14 1407  BP: 123/85  Pulse: 96  Temp: 98.4 F (36.9 C)  Resp: 16    Physical Exam  Constitutional: She is oriented to person, place, and time and well-developed, well-nourished, and in no distress.  obese  HENT:  Head: Normocephalic and atraumatic.  Nose: Nose normal.  Mouth/Throat: Oropharynx is clear and moist. No oropharyngeal exudate.  Eyes:  Conjunctivae and EOM are normal. Pupils are equal, round, and reactive to light. Right eye exhibits no discharge. Left eye exhibits no discharge. No scleral icterus.  Neck: Normal range of motion. Neck supple. No tracheal deviation present. No thyromegaly present.  Cardiovascular: Normal rate, regular rhythm and normal heart sounds.  Exam reveals no gallop and no friction rub.   No murmur heard. Pulmonary/Chest: Effort normal and breath sounds normal. She has no wheezes. She has no rales.  Abdominal: Soft. Bowel sounds are normal. She exhibits no distension and no mass. There is no tenderness. There is no rebound and no guarding.  Musculoskeletal: Normal range of motion. She exhibits edema.  RUE lymphedema  Lymphadenopathy:    She has no cervical adenopathy.  Neurological: She is alert and oriented to person, place, and time. She has normal reflexes. No cranial nerve deficit. Gait normal. Coordination normal.  Skin: Skin is warm and dry. No rash noted.  Psychiatric: Mood, memory, affect and judgment normal.  Nursing note and vitals reviewed. Breast Exam: Bilateral breast exam is performed with the right breast showing post radiation changes otherwise no palpable masses no nipple retraction no suspicious skin changes both axillary regions are without palpable adenopathy  LABORATORY DATA:  CBC    Component Value Date/Time   WBC 8.5 04/08/2014 1550   RBC 4.55 04/08/2014 1550   HGB 14.2 04/08/2014 1550   HCT 43.3 04/08/2014 1550   PLT 207 04/08/2014 1550   MCV 95.2 04/08/2014 1550   MCH 31.2 04/08/2014 1550   MCHC 32.8 04/08/2014 1550   RDW 14.0 04/08/2014 1550   LYMPHSABS 2.4 04/08/2014 1550   MONOABS 0.6 04/08/2014 1550   EOSABS 0.1 04/08/2014 1550   BASOSABS 0.0 04/08/2014 1550   CMP     Component Value Date/Time   NA 141 04/08/2014 1550   K 4.0 04/08/2014 1550   CL 104 04/08/2014 1550   CO2 28 04/08/2014 1550   GLUCOSE 86 04/08/2014 1550   BUN 15 04/08/2014 1550    CREATININE 0.76 04/08/2014 1550   CALCIUM 9.0 04/08/2014 1550   PROT 7.3 04/08/2014 1550   ALBUMIN 3.8 04/08/2014 1550   AST 27 04/08/2014 1550   ALT 23 04/08/2014 1550   ALKPHOS 77 04/08/2014 1550   BILITOT 0.3 04/08/2014 1550   GFRNONAA >90 04/08/2014 1550   GFRAA >90 04/08/2014 1550       ASSESSMENT and THERAPY PLAN:  Stage II grade 2 infiltrating ductal carcinoma of the right breast, ER positive, PR positive, HER-2 negative  Right upper extremity lymphedema Tamoxifen-induced hot flashes Tobacco use Premenopausal status  She will continue with her tamoxifen daily. Her hot flashes are problematic at times but she feels able to continue with treatment. The plan will be for 10 years of therapy unless she becomes postmenopausal and we can discuss changing her to an aromatase inhibitor. She was instructed to take calcium and vitamin D.  I again addressed tobacco cessation with the patient. She is currently not willing to discontinue smoking. He discussed the health benefits of smoking discontinuation in the ongoing problems for her health if she continues smoking. She is well aware.  She is overdue on screening mammography and we will arrange this for her. I again emphasized the importance of ongoing screening yearly mammograms.   All questions were answered. The patient knows to call the clinic with any problems, questions or concerns. We can certainly see the patient much sooner if necessary. This note was electronically signed. Molli Hazard, MD   This document serves as a record of services personally performed by Ancil Linsey, MD. It was created on her behalf by Pearlie Oyster, a trained medical scribe. The creation of this record is based on the scribe's personal observations and the provider's statements to them. This document has been checked and approved by the attending provider.

## 2014-05-05 ENCOUNTER — Encounter (HOSPITAL_COMMUNITY): Payer: Self-pay | Admitting: Hematology & Oncology

## 2014-05-20 ENCOUNTER — Ambulatory Visit (HOSPITAL_COMMUNITY)
Admission: RE | Admit: 2014-05-20 | Discharge: 2014-05-20 | Disposition: A | Discharge status: Home / Self Care | Payer: BLUE CROSS/BLUE SHIELD | Source: Ambulatory Visit | Attending: Hematology & Oncology | Admitting: Hematology & Oncology

## 2014-05-20 ENCOUNTER — Ambulatory Visit (HOSPITAL_COMMUNITY): Payer: Self-pay

## 2014-05-20 DIAGNOSIS — C50411 Malignant neoplasm of upper-outer quadrant of right female breast: Secondary | ICD-10-CM | POA: Insufficient documentation

## 2014-05-20 DIAGNOSIS — Z9011 Acquired absence of right breast and nipple: Secondary | ICD-10-CM | POA: Diagnosis not present

## 2014-05-26 ENCOUNTER — Other Ambulatory Visit (HOSPITAL_COMMUNITY): Payer: Self-pay | Admitting: Oncology

## 2014-05-26 DIAGNOSIS — C50411 Malignant neoplasm of upper-outer quadrant of right female breast: Secondary | ICD-10-CM

## 2014-05-26 MED ORDER — TAMOXIFEN CITRATE 20 MG PO TABS: 20.0000 mg | ORAL_TABLET | Freq: Every day | ORAL | Status: DC

## 2014-08-12 ENCOUNTER — Other Ambulatory Visit (HOSPITAL_COMMUNITY): Payer: Self-pay

## 2014-08-12 ENCOUNTER — Ambulatory Visit (HOSPITAL_COMMUNITY): Payer: Self-pay | Admitting: Hematology & Oncology

## 2014-10-22 ENCOUNTER — Ambulatory Visit: Payer: Self-pay | Admitting: "Endocrinology

## 2014-10-29 ENCOUNTER — Other Ambulatory Visit: Payer: Self-pay | Admitting: "Endocrinology

## 2014-10-29 ENCOUNTER — Other Ambulatory Visit: Payer: Self-pay

## 2014-10-29 MED ORDER — LEVOTHYROXINE SODIUM 175 MCG PO TABS: 175.0000 ug | ORAL_TABLET | Freq: Every morning | ORAL | Status: DC

## 2014-11-19 ENCOUNTER — Ambulatory Visit (INDEPENDENT_AMBULATORY_CARE_PROVIDER_SITE_OTHER): Payer: 59 | Admitting: "Endocrinology

## 2014-11-19 ENCOUNTER — Encounter: Payer: Self-pay | Admitting: "Endocrinology

## 2014-11-19 VITALS — BP 117/82 | HR 88 | Ht 63.0 in | Wt 199.0 lb

## 2014-11-19 DIAGNOSIS — E032 Hypothyroidism due to medicaments and other exogenous substances: Secondary | ICD-10-CM | POA: Diagnosis not present

## 2014-11-19 DIAGNOSIS — E890 Postprocedural hypothyroidism: Secondary | ICD-10-CM | POA: Insufficient documentation

## 2014-11-19 MED ORDER — LEVOTHYROXINE SODIUM 25 MCG PO TABS: 25.0000 ug | ORAL_TABLET | Freq: Every day | ORAL | Status: DC

## 2014-11-19 NOTE — Progress Notes (Signed)
Subjective:    Patient ID: Sheila Jefferson, female    DOB: 29-May-1965,    Past Medical History  Diagnosis Date  . GERD (gastroesophageal reflux disease)   . Bulging lumbar disc   . Cancer of upper-outer quadrant of female breast ([place]) 01/06/2011    right breast   Past Surgical History  Procedure Laterality Date  . Breast reduction surgery  1997  . Cholecystectomy  2003  . Tubal ligation  2003  . Pilonidal cyst      1980-81-83  . Colonoscopy    . Portacath placement  02/11/2011    Procedure: INSERTION PORT-A-CATH;  Surgeon: Haywood Lasso, MD;  Location: [facility];  Service: General;  Laterality: Left;  INSERTION PORT-A-CATH LEFT SUBCLAVIAN  . Breast lumpectomy  02/11/2011    right breast and axillary dissection  . Uterine fibroid surgery  09/30/2011    removal 8 polyps - Dr Derenda Mis  . Port-a-cath removal  11/10/2011    Procedure: MINOR REMOVAL PORT-A-CATH;  Surgeon: Haywood Lasso, MD;  Location: [facility];  Service: General;  Laterality: Left;   Social History   Social History  . Marital Status: Married    Spouse Name: N/A  . Number of Children: N/A  . Years of Education: N/A   Social History Main Topics  . Smoking status: Current Every Day Smoker -- 0.50 packs/day for 30 years    Types: Cigarettes  . Smokeless tobacco: Never Used  . Alcohol Use: No  . Drug Use: Yes    Special: Hydrocodone  . Sexual Activity: Yes   Other Topics Concern  . None   Social History Narrative   Outpatient Encounter Prescriptions as of 11/19/2014  Medication Sig  . ALPRAZolam (XANAX) 0.5 MG tablet Take 0.5 mg by mouth daily as needed. For sleep  . esomeprazole (NEXIUM) 40 MG capsule Take 40 mg by mouth 2 (two) times daily.  Marland Kitchen levothyroxine (SYNTHROID) 175 MCG tablet Take 1 tablet (175 mcg total) by mouth every morning.  Marland Kitchen oxyCODONE (OXY IR/ROXICODONE) 5 MG immediate release tablet Take 5 mg by mouth 3 (three) times daily as needed.   . tamoxifen (NOLVADEX) 20 MG  tablet Take 1 tablet (20 mg total) by mouth daily.  . furosemide (LASIX) 20 MG tablet Take one tablet daily in the morning to control lower extremity swelling. Take potassium supplements while taking this drug. (Patient not taking: Reported on 04/08/2014)  . ibuprofen (ADVIL,MOTRIN) 200 MG tablet Take 200 mg by mouth every 6 (six) hours as needed.  Marland Kitchen levothyroxine (LEVOTHROID) 25 MCG tablet Take 1 tablet (25 mcg total) by mouth daily before breakfast.  . potassium chloride SA (K-DUR,KLOR-CON) 20 MEQ tablet Take 1 tablet daily while taking Lasix (Patient not taking: Reported on 04/08/2014)  . venlafaxine XR (EFFEXOR XR) 75 MG 24 hr capsule Take 1 capsule (75 mg total) by mouth daily. (Patient not taking: Reported on 04/08/2014)   No facility-administered encounter medications on file as of 11/19/2014.   ALLERGIES: No Known Allergies VACCINATION STATUS:  There is no immunization history on file for this patient.  HPI  Sheila Jefferson is a 49 - yr-old female patient with medical history as above.  She is here for a f/u after RAI ablative therapy for GD on Novemeber 6, 2015.  Her most recent TFT show TSH of 6.7 improving from  19.5 and ft4 at 1.2. Pt has family history of thyroid dysfunction in her mother and sister -hyperthyroidism. She denies personal history  of goiter.    Review of Systems Constitutional: no weight gain/loss, no fatigue, no subjective hyperthermia/hypothermia Eyes: no blurry vision, no xerophthalmia ENT: no sore throat, no nodules palpated in throat, no dysphagia/odynophagia, no hoarseness Cardiovascular: no CP/SOB/palpitations/leg swelling Respiratory: no cough/SOB Gastrointestinal: no N/V/D/C Musculoskeletal: no muscle/joint aches Skin: no rashes Neurological: no tremors/numbness/tingling/dizziness Psychiatric: no depression/anxiety  Objective:    BP 117/82 mmHg  Pulse 88  Ht 5\' 3"  (1.6 m)  Wt 199 lb (90.266 kg)  BMI 35.26 kg/m2  SpO2 97%  LMP 01/06/2011  Wt  Readings from Last 3 Encounters:  11/19/14 199 lb (90.266 kg)  04/08/14 202 lb (91.627 kg)  10/08/13 190 lb (86.183 kg)    Physical Exam    Constitutional: overweight, in NAD Eyes: PERRLA, EOMI, no exophthalmos ENT: moist mucous membranes, no thyromegaly, no cervical lymphadenopathy Cardiovascular: RRR, No MRG Respiratory: CTA B Gastrointestinal: abdomen soft, NT, ND, BS+ Musculoskeletal: no deformities, strength intact in all 4 Skin: moist, warm, no rashes Neurological: no tremor with outstretched hands, DTR normal in all 4  Results for orders placed or performed in visit on 04/08/14  CBC with Differential  Result Value Ref Range   WBC 8.5 4.0 - 10.5 K/uL   RBC 4.55 3.87 - 5.11 MIL/uL   Hemoglobin 14.2 12.0 - 15.0 g/dL   HCT 43.3 36.0 - 46.0 %   MCV 95.2 78.0 - 100.0 fL   MCH 31.2 26.0 - 34.0 pg   MCHC 32.8 30.0 - 36.0 g/dL   RDW 14.0 11.5 - 15.5 %   Platelets 207 150 - 400 K/uL   Neutrophils Relative % 63 43 - 77 %   Neutro Abs 5.3 1.7 - 7.7 K/uL   Lymphocytes Relative 29 12 - 46 %   Lymphs Abs 2.4 0.7 - 4.0 K/uL   Monocytes Relative 7 3 - 12 %   Monocytes Absolute 0.6 0.1 - 1.0 K/uL   Eosinophils Relative 1 0 - 5 %   Eosinophils Absolute 0.1 0.0 - 0.7 K/uL   Basophils Relative 0 0 - 1 %   Basophils Absolute 0.0 0.0 - 0.1 K/uL  Comprehensive metabolic panel  Result Value Ref Range   Sodium 141 135 - 145 mmol/L   Potassium 4.0 3.5 - 5.1 mmol/L   Chloride 104 96 - 112 mmol/L   CO2 28 19 - 32 mmol/L   Glucose, Bld 86 70 - 99 mg/dL   BUN 15 6 - 23 mg/dL   Creatinine, Ser 0.76 0.50 - 1.10 mg/dL   Calcium 9.0 8.4 - 10.5 mg/dL   Total Protein 7.3 6.0 - 8.3 g/dL   Albumin 3.8 3.5 - 5.2 g/dL   AST 27 0 - 37 U/L   ALT 23 0 - 35 U/L   Alkaline Phosphatase 77 39 - 117 U/L   Total Bilirubin 0.3 0.3 - 1.2 mg/dL   GFR calc non Af Amer >90 >90 mL/min   GFR calc Af Amer >90 >90 mL/min   Anion gap 9 5 - 15  Estradiol  Result Value Ref Range   Estradiol [id number] pg/mL   Follicle stimulating hormone  Result Value Ref Range   FSH 20.1 mIU/mL   Complete Blood Count (Most recent): Lab Results  Component Value Date   WBC 8.5 04/08/2014   HGB 14.2 04/08/2014   HCT 43.3 04/08/2014   MCV 95.2 04/08/2014   PLT 207 04/08/2014   Chemistry (most recent): Lab Results  Component Value Date   NA 141 04/08/2014  K 4.0 04/08/2014   CL 104 04/08/2014   CO2 28 04/08/2014   BUN 15 04/08/2014   CREATININE 0.76 04/08/2014   Diabetic Labs (most recent): No results found for: HGBA1C Lipid profile (most recent): Lab Results  Component Value Date   TRIG 349* 03/25/2013   CHOL 147 03/25/2013         Assessment & Plan:   1. Hypothyroidism due to medicaments and other exogenous substances:  She has Grave's disease s/p RAI therapy on November 08, 2013. her labs are reversed, TSH still high at 6.7. She would benefit from a slight increase in her Synthroid. I will increase Synthroid to 200 mcg po qam.   - We discussed about correct intake of levothyroxine, at fasting, with water, separated by at least 30 minutes from breakfast, and separated by more than 4 hours from calcium, iron, multivitamins, acid reflux medications (PPIs). -Patient is made aware of the fact that thyroid hormone replacement is needed for life, dose to be adjusted by periodic monitoring of thyroid function tests.  I advised patient to maintain close follow up with their PCP for primary care needs. Follow up plan: Return in about 3 months (around 02/19/2015) for underactive thyroid.  Glade Lloyd, MD Phone: [phone]  Fax: [phone]   11/19/2014, 6:17 PM

## 2014-11-21 ENCOUNTER — Telehealth (HOSPITAL_COMMUNITY): Payer: Self-pay

## 2014-11-21 NOTE — Telephone Encounter (Signed)
Requests refill for Tamoxifen uses [facility] in [place].  Has not been seen since April .

## 2014-11-24 ENCOUNTER — Other Ambulatory Visit (HOSPITAL_COMMUNITY): Payer: Self-pay | Admitting: Hematology & Oncology

## 2014-11-24 ENCOUNTER — Telehealth (HOSPITAL_COMMUNITY): Payer: Self-pay

## 2014-11-24 ENCOUNTER — Other Ambulatory Visit (HOSPITAL_COMMUNITY): Payer: Self-pay

## 2014-11-24 DIAGNOSIS — C50411 Malignant neoplasm of upper-outer quadrant of right female breast: Secondary | ICD-10-CM

## 2014-11-24 MED ORDER — TAMOXIFEN CITRATE 20 MG PO TABS: 20.0000 mg | ORAL_TABLET | Freq: Every day | ORAL | Status: DC

## 2014-11-24 NOTE — Telephone Encounter (Signed)
Patient notified and appointment scheduled. 

## 2014-11-24 NOTE — Telephone Encounter (Signed)
Ok to refill 1 time. Set her up for appointment with Tom asap. Advise we cannot continue to refill without adequate f/u. Dr.P

## 2014-11-24 NOTE — Telephone Encounter (Signed)
Patient notified that 1 month's supply of Tamoxifen was e-scribed to her pharmacy and that no further refills would be authorized until seen in the office.  Appointment scheduled for 12/16/14.

## 2014-12-16 ENCOUNTER — Ambulatory Visit (HOSPITAL_COMMUNITY): Payer: BLUE CROSS/BLUE SHIELD | Admitting: Oncology

## 2015-01-13 NOTE — Progress Notes (Signed)
Sheila Percy, MD [place] [address]  Malignant neoplasm of upper-outer quadrant of right female breast ([place]) - Plan: CBC with Differential, Comprehensive metabolic panel, Luteinizing hormone, Follicle stimulating hormone, Estradiol, CBC with Differential, Comprehensive metabolic panel, Estradiol, Follicle stimulating hormone, CBC with Differential, Comprehensive metabolic panel, Luteinizing hormone, Follicle stimulating hormone, Estradiol  CURRENT THERAPY: Tamoxifen 20 mg daily and surveillance per NCCN guidelines  INTERVAL HISTORY: Sheila Jefferson 50 y.o. female returns for  regular  visit for followup of Stage II, grade 2, infiltrating ductal carcinoma the right breast, 2.5 cm in size with one positive node out of 15 and one additional high axillary node given her total 16 nodes. HER-2/neu negative, ER +90%, PR +80%, Ki-67 marker high at 71%, status post dose dense Adriamycin and Cytoxan for 4 cycles followed by Taxotere for 4 cycles. She is now on Tamoxifen 20 mg daily beginning on 10/21/2011.    Cancer of upper-outer quadrant of female breast, right   01/06/2011 Initial Diagnosis Cancer of upper-outer quadrant of female breast, right   01/17/2011 Initial Biopsy Right breast and axilla biopsy demonstrating invasive ductal carcinoma with metastatic disease in 1/1 lymph nodes.  ER 90%, PR 80%, Ki-67 71%, and Her2 negative by FISH.   02/11/2011 Definitive Surgery Right breast lumpectomy with right regional lymph node dissection- invasive ductal carcinoma, grade II, 2.5 cm.  No LVI.  CLear margins. 1/16 lymph nodes positive for metastatic ductal carcinoma.     03/01/2011 - 04/12/2011 Chemotherapy AC x 4 cycles   04/26/2011 - 06/21/2011 Chemotherapy Taxotere x 4 cycles   07/15/2011 - 08/26/2011 Radiation Therapy Dr. Pablo Ledger at [facility]   08/29/2011 -  Anti-estrogen oral therapy Tamoxifen 20 mg daily.     I personally reviewed and went over laboratory results with the  patient.  The results are noted within this dictation.  I personally reviewed and went over radiographic studies with the patient.  The results are noted within this dictation.  Mammogram on 05/20/2014 was BIRADS 2.  She will be due again in May 2017.  She is working with endocrinology, Dr. Dorris Fetch, for hypothyroidism.  She is S/P RAI therapy on 11/08/2013 for Grave's disease.  Dr. Dorris Fetch is managing the patient's Synthroid Rx.   Her biggest issue, she reports is her vision.  She notes that she is having difficulty with focusing of her field of vision.  She is now seeing Dr. Iona Hansen and he is working her up for this.     She continues to work for an [employer] and recently her company provided her a standing desk to work at.  This has helped with LE edema and her back pain.  She otherwise denies any oncology complaints.  Past Medical History  Diagnosis Date  . GERD (gastroesophageal reflux disease)   . Bulging lumbar disc   . Cancer of upper-outer quadrant of female breast ([place]) 01/06/2011    right breast    has Cancer of upper-outer quadrant of female breast, right and Hypothyroidism due to medicaments and other exogenous substances on her problem list.     has No Known Allergies.  Ms. [name] had no medications administered during this visit.  Past Surgical History  Procedure Laterality Date  . Breast reduction surgery  1997  . Cholecystectomy  2003  . Tubal ligation  2003  . Pilonidal cyst      1980-81-83  . Colonoscopy    . Portacath placement  02/11/2011  Procedure: INSERTION PORT-A-CATH;  Surgeon: Haywood Lasso, MD;  Location: [facility];  Service: General;  Laterality: Left;  INSERTION PORT-A-CATH LEFT SUBCLAVIAN  . Breast lumpectomy  02/11/2011    right breast and axillary dissection  . Uterine fibroid surgery  09/30/2011    removal 8 polyps - Dr Derenda Mis  . Port-a-cath removal  11/10/2011    Procedure: MINOR REMOVAL PORT-A-CATH;  Surgeon: Haywood Lasso, MD;  Location:  [facility];  Service: General;  Laterality: Left;    Denies any headaches, dizziness, double vision, fevers, chills, night sweats, nausea, vomiting, diarrhea, constipation, chest pain, heart palpitations, shortness of breath, blood in stool, black tarry stool, urinary pain, urinary burning, urinary frequency, hematuria.   PHYSICAL EXAMINATION  ECOG PERFORMANCE STATUS: 1 - Symptomatic but completely ambulatory  Filed Vitals:   01/15/15 1427  BP: 119/67  Pulse: 102  Temp: 98.2 F (36.8 C)  Resp: 18    GENERAL:alert, no distress, well nourished, well developed, comfortable, cooperative and smiling, unaccompanied. SKIN: skin color, texture, turgor are normal, no rashes or significant lesions HEAD: Normocephalic, No masses, lesions, tenderness or abnormalities EYES: normal, PERRLA, EOMI, Conjunctiva are pink and non-injected EARS: External ears normal OROPHARYNX:lips, buccal mucosa, and tongue normal and mucous membranes are moist  NECK: supple, no adenopathy, thyroid normal size, non-tender, without nodularity, no stridor, non-tender, trachea midline LYMPH:  no palpable lymphadenopathy, no hepatosplenomegaly BREAST:Left breast without any masses or lesions, no nipple or skin changes, no axillary adenopathy.  Right breast S/P lumpectomy and healed surgical site without any masses, lesions, or skin abnormalities. LUNGS: clear to auscultation and percussion HEART: regular rate & rhythm, no murmurs, no gallops, S1 normal and S2 normal ABDOMEN: abdomen soft, non-tender, obese, normal bowel sounds, no masses or organomegaly and no hepatosplenomegaly. BACK: Back symmetric, no curvature., No CVA tenderness EXTREMITIES:less then 2 second capillary refill, no joint deformities, effusion, or inflammation, no skin discoloration, no clubbing, no cyanosis, Right upper extremity lymphedema  NEURO: alert & oriented x 3 with fluent speech, no focal motor/sensory deficits, gait  normal    LABORATORY DATA: CBC    Component Value Date/Time   WBC 8.5 04/08/2014 1550   RBC 4.55 04/08/2014 1550   HGB 14.2 04/08/2014 1550   HCT 43.3 04/08/2014 1550   PLT 207 04/08/2014 1550   MCV 95.2 04/08/2014 1550   MCH 31.2 04/08/2014 1550   MCHC 32.8 04/08/2014 1550   RDW 14.0 04/08/2014 1550   LYMPHSABS 2.4 04/08/2014 1550   MONOABS 0.6 04/08/2014 1550   EOSABS 0.1 04/08/2014 1550   BASOSABS 0.0 04/08/2014 1550      Chemistry      Component Value Date/Time   NA 141 04/08/2014 1550   K 4.0 04/08/2014 1550   CL 104 04/08/2014 1550   CO2 28 04/08/2014 1550   BUN 15 04/08/2014 1550   CREATININE 0.76 04/08/2014 1550      Component Value Date/Time   CALCIUM 9.0 04/08/2014 1550   ALKPHOS 77 04/08/2014 1550   AST 27 04/08/2014 1550   ALT 23 04/08/2014 1550   BILITOT 0.3 04/08/2014 1550        RADIOGRAPHIC STUDIES:   CLINICAL DATA: History of right lumpectomy 2013.  EXAM: DIGITAL DIAGNOSTIC BILATERAL MAMMOGRAM WITH CAD  COMPARISON: 03/27/2013 and earlier  ACR Breast Density Category b: There are scattered areas of fibroglandular density.  FINDINGS: Postoperative changes are seen in the right breast. No suspicious mass, distortion, or microcalcifications are identified to suggest presence of malignancy.  Mammographic images were processed with CAD.  IMPRESSION: No mammographic evidence for malignancy.  RECOMMENDATION: Diagnostic mammogram is suggested in 1 year. (Code:DM-B-01Y)  I have discussed the findings and recommendations with the patient. Results were also provided in writing at the conclusion of the visit. If applicable, a reminder letter will be sent to the patient regarding the next appointment.  BI-RADS CATEGORY 2: Benign.   Electronically Signed  By: Nolon Nations M.D.  On: 05/20/2014 09:05       ASSESSMENT/PLAN:  Cancer of upper-outer quadrant of female breast, right Stage II, grade 2, infiltrating  ductal carcinoma the right breast, 2.5 cm in size with one positive node out of 15 and one additional high axillary node given her total 16 nodes. HER-2/neu negative, ER +90%, PR +80%, Ki-67 marker high at 71%, status post dose dense Adriamycin and Cytoxan for 4 cycles followed by Taxotere for 4 cycles. She is now on Tamoxifen 20 mg daily beginning on 10/21/2011 which she will take for 5-10 years +/- switch to AI when proven to be post-menopausal.  Oncology history is up-to-date.  Labs were planned today but since she has an upcoming appointment for labs for Dr. Dorris Fetch at [facility], I will write an order for upcoming labs: CBC diff, CMET, estradiol, FSH, LH  Next mammogram is due in May 2017.  Continue follow-up with Dr. Dorris Fetch, Endocrinology, as directed with compliance with Synthroid.  Labs in 6 months: CBC diff, CMET, estradiol, FSH.  Once labs demonstrate post-menopausal status, discussion regarding transition to AI therapy will need to take place given their superiority to Tamoxifen.  She will need a baseline bone density prior to this transition when the time comes.  Return in 6 months for follow-up.  She is working with Dr. Iona Hansen for her vision.    THERAPY PLAN:  Continue Tamoxifen daily and we will continue to follow NCCN guidelines.  NCCN guidelines recommends the following surveillance for invasive breast cancer:  A. History and Physical exam every 4-6 months for 5 years and then every 12 months.  B. Mammography every 12 months  C. Women on Tamoxifen: annual gynecologic assessment every 12 months if uterus is present.  D. Women on aromatase inhibitor or who experience ovarian failure secondary to treatment should have monitoring of bone health with a bone mineral density determination at baseline and periodically thereafter.  E. Assess and encourage adherence to adjuvant endocrine therapy.  F. Evidence suggests that active lifestyle and achieving and maintaining an ideal body weight  (20-25 BMI) may lead to optimal breast cancer outcomes.  NCCN guidelines recommends monitoring for the following for those patients on Tamoxifen/Raloxifene Therapy:  A. Asymptomatic   1. Continue risk-reduction agent   2. Continue follow-up  B. Hot-flashes or other risk-reduction, agent related symptoms   1. Symptomatic treatment.    2. If persists, re-evaluate role of risk-reduction agent   3. Continue risk-reduction agent- Continue follow-up  C. Abnormal vaginal bleeding   1. Prompt evaluation for endometrial cancer if uterus intact    A. If endometrial pathology found, re-initiation of Tamoxifen may be considered after hysterectomy if early-stage disease     1. Continue follow-up    B. If no endometrial pathology (carcinoma or hyperplasia with or without atypia) found, continue Tamoxifen and re-evaluate if symptoms persist or recur.     1. Continue follow-up  D. Anticipated elective surgery   1. Consider discontinuing Tamoxifen or Raloxifene prior to elective surgery   2. Resume Tamoxifen or Raloxifene postoperatively when ambulation  is normal  E. Deep vein thrombosis, pulmonary embolism, cerebrovascular accident, or prolonged immobilization   1. Discontinue tamoxifen or raloxifene, treat underlying condition.   All questions were answered. The patient knows to call the clinic with any problems, questions or concerns. We can certainly see the patient much sooner if necessary.  Patient and plan discussed with Dr. Ancil Linsey and she is in agreement with the aforementioned.   KEFALAS,THOMAS 01/15/2015 4:47 PM

## 2015-01-13 NOTE — Assessment & Plan Note (Addendum)
Stage II, grade 2, infiltrating ductal carcinoma the right breast, 2.5 cm in size with one positive node out of 15 and one additional high axillary node given her total 16 nodes. HER-2/neu negative, ER +90%, PR +80%, Ki-67 marker high at 71%, status post dose dense Adriamycin and Cytoxan for 4 cycles followed by Taxotere for 4 cycles. She is now on Tamoxifen 20 mg daily beginning on 10/21/2011 which she will take for 5-10 years +/- switch to AI when proven to be post-menopausal.  Oncology history is up-to-date.  Labs were planned today but since she has an upcoming appointment for labs for Dr. Dorris Fetch at [facility], I will write an order for upcoming labs: CBC diff, CMET, estradiol, FSH, LH  Next mammogram is due in May 2017.  Continue follow-up with Dr. Dorris Fetch, Endocrinology, as directed with compliance with Synthroid.  Labs in 6 months: CBC diff, CMET, estradiol, FSH.  Once labs demonstrate post-menopausal status, discussion regarding transition to AI therapy will need to take place given their superiority to Tamoxifen.  She will need a baseline bone density prior to this transition when the time comes.  Return in 6 months for follow-up.  She is working with Dr. Iona Hansen for her vision.

## 2015-01-15 ENCOUNTER — Encounter (HOSPITAL_COMMUNITY): Payer: BLUE CROSS/BLUE SHIELD | Attending: Oncology | Admitting: Oncology

## 2015-01-15 ENCOUNTER — Encounter (HOSPITAL_COMMUNITY): Payer: Self-pay | Admitting: Oncology

## 2015-01-15 VITALS — BP 119/67 | HR 102 | Temp 98.2°F | Resp 18 | Wt 201.0 lb

## 2015-01-15 DIAGNOSIS — E039 Hypothyroidism, unspecified: Secondary | ICD-10-CM | POA: Diagnosis not present

## 2015-01-15 DIAGNOSIS — M545 Low back pain: Secondary | ICD-10-CM | POA: Diagnosis not present

## 2015-01-15 DIAGNOSIS — C50411 Malignant neoplasm of upper-outer quadrant of right female breast: Secondary | ICD-10-CM

## 2015-01-15 DIAGNOSIS — C773 Secondary and unspecified malignant neoplasm of axilla and upper limb lymph nodes: Secondary | ICD-10-CM | POA: Diagnosis not present

## 2015-01-15 DIAGNOSIS — H538 Other visual disturbances: Secondary | ICD-10-CM

## 2015-01-15 NOTE — Patient Instructions (Signed)
[place] at [facility] Discharge Instructions  RECOMMENDATIONS MADE BY THE CONSULTANT AND ANY TEST RESULTS WILL BE SENT TO YOUR REFERRING PHYSICIAN.     Exam completed by Kirby Crigler PA today Clinical breast exam today Orders for Dr Dorris Fetch to get your lab work for [facility] in 6 months Return to see the doctor in 6 months Please call the clinic if you have any questions or concerns      Thank you for choosing [place] at [facility] to provide your oncology and hematology care.  To afford each patient quality time with our provider, please arrive at least 15 minutes before your scheduled appointment time.    You need to re-schedule your appointment should you arrive 10 or more minutes late.  We strive to give you quality time with our providers, and arriving late affects you and other patients whose appointments are after yours.  Also, if you no show three or more times for appointments you may be dismissed from the clinic at the providers discretion.     Again, thank you for choosing [facility].  Our [place] is that these requests will decrease the amount of time that you wait before being seen by our physicians.       _____________________________________________________________  Should you have questions after your visit to [facility], please contact our office at [phone] between the hours of 8:30 a.m. and 4:30 p.m.  Voicemails left after 4:30 p.m. will not be returned until the following business day.  For prescription refill requests, have your pharmacy contact our office.

## 2015-02-03 ENCOUNTER — Other Ambulatory Visit: Payer: Self-pay

## 2015-02-03 MED ORDER — LEVOTHYROXINE SODIUM 200 MCG PO TABS: 200.0000 ug | ORAL_TABLET | Freq: Every day | ORAL | Status: DC

## 2015-02-19 ENCOUNTER — Ambulatory Visit: Payer: 59 | Admitting: "Endocrinology

## 2015-02-22 ENCOUNTER — Other Ambulatory Visit (HOSPITAL_COMMUNITY): Payer: Self-pay | Admitting: Hematology & Oncology

## 2015-02-23 ENCOUNTER — Other Ambulatory Visit: Payer: Self-pay | Admitting: "Endocrinology

## 2015-02-23 DIAGNOSIS — E039 Hypothyroidism, unspecified: Secondary | ICD-10-CM

## 2015-02-23 LAB — T4, FREE: Free T4: 1.2 ng/dL (ref 0.8–1.8)

## 2015-02-23 LAB — TSH: TSH: 1.61 mIU/L

## 2015-02-25 ENCOUNTER — Telehealth (HOSPITAL_COMMUNITY): Payer: Self-pay | Admitting: Emergency Medicine

## 2015-02-25 MED ORDER — TAMOXIFEN CITRATE 20 MG PO TABS: 20.0000 mg | ORAL_TABLET | Freq: Every day | ORAL | Status: DC

## 2015-02-25 NOTE — Telephone Encounter (Signed)
Went over lab results with pt, not post-menopausal. Continue taking tamoxifen.  Refilled pts tamoxifen.

## 2015-03-12 ENCOUNTER — Encounter: Payer: Self-pay | Admitting: "Endocrinology

## 2015-03-12 ENCOUNTER — Ambulatory Visit (INDEPENDENT_AMBULATORY_CARE_PROVIDER_SITE_OTHER): Payer: 59 | Admitting: "Endocrinology

## 2015-03-12 VITALS — BP 114/78 | HR 66 | Ht 63.0 in | Wt 201.0 lb

## 2015-03-12 DIAGNOSIS — E032 Hypothyroidism due to medicaments and other exogenous substances: Secondary | ICD-10-CM

## 2015-03-12 MED ORDER — LEVOTHYROXINE SODIUM 200 MCG PO TABS: 200.0000 ug | ORAL_TABLET | Freq: Every day | ORAL | Status: DC

## 2015-03-12 NOTE — Progress Notes (Signed)
Subjective:    Patient ID: Sheila Jefferson, female    DOB: 02-08-1965,    Past Medical History  Diagnosis Date  . GERD (gastroesophageal reflux disease)   . Bulging lumbar disc   . Cancer of upper-outer quadrant of female breast ([place]) 01/06/2011    right breast   Past Surgical History  Procedure Laterality Date  . Breast reduction surgery  1997  . Cholecystectomy  2003  . Tubal ligation  2003  . Pilonidal cyst      1980-81-83  . Colonoscopy    . Portacath placement  02/11/2011    Procedure: INSERTION PORT-A-CATH;  Surgeon: Haywood Lasso, MD;  Location: [place];  Service: General;  Laterality: Left;  INSERTION PORT-A-CATH LEFT SUBCLAVIAN  . Breast lumpectomy  02/11/2011    right breast and axillary dissection  . Uterine fibroid surgery  09/30/2011    removal 8 polyps - Dr Derenda Mis  . Port-a-cath removal  11/10/2011    Procedure: MINOR REMOVAL PORT-A-CATH;  Surgeon: Haywood Lasso, MD;  Location: [place];  Service: General;  Laterality: Left;   Social History   Social History  . Marital Status: Married    Spouse Name: N/A  . Number of Children: N/A  . Years of Education: N/A   Social History Main Topics  . Smoking status: Current Every Day Smoker -- 0.50 packs/day for 30 years    Types: Cigarettes  . Smokeless tobacco: Never Used  . Alcohol Use: No  . Drug Use: Yes    Special: Hydrocodone  . Sexual Activity: Yes   Other Topics Concern  . None   Social History Narrative   Outpatient Encounter Prescriptions as of 03/12/2015  Medication Sig  . ALPRAZolam (XANAX) 0.5 MG tablet Take 0.5 mg by mouth daily as needed. For sleep  . levothyroxine (SYNTHROID) 200 MCG tablet Take 1 tablet (200 mcg total) by mouth daily before breakfast.  . oxyCODONE (OXY IR/ROXICODONE) 5 MG immediate release tablet Take 5 mg by mouth 3 (three) times daily as needed.   . tamoxifen (NOLVADEX) 20 MG tablet Take 1 tablet (20 mg total) by mouth daily.  . [DISCONTINUED]  furosemide (LASIX) 20 MG tablet Take one tablet daily in the morning to control lower extremity swelling. Take potassium supplements while taking this drug. (Patient not taking: Reported on 04/08/2014)  . [DISCONTINUED] ibuprofen (ADVIL,MOTRIN) 200 MG tablet Take 200 mg by mouth every 6 (six) hours as needed. Reported on 01/15/2015  . [DISCONTINUED] levothyroxine (SYNTHROID) 200 MCG tablet Take 1 tablet (200 mcg total) by mouth daily before breakfast.  . [DISCONTINUED] potassium chloride SA (K-DUR,KLOR-CON) 20 MEQ tablet Take 1 tablet daily while taking Lasix (Patient not taking: Reported on 04/08/2014)   No facility-administered encounter medications on file as of 03/12/2015.   ALLERGIES: No Known Allergies VACCINATION STATUS:  There is no immunization history on file for this patient.  HPI  Sheila Jefferson is a 21 - yr-old female patient with medical history as above.  She is here for a f/u after RAI ablative therapy for GD on Novemeber 6, 2015.  Her most recent TFT show  Thyroid function tests are consistent with appropriate replacement. She is compliant, has no new complaints. Pt has family history of thyroid dysfunction in her mother and sister -hyperthyroidism. She denies personal history of goiter.    Review of Systems Constitutional: no weight gain/loss, no fatigue, no subjective hyperthermia/hypothermia Eyes: no blurry vision, no xerophthalmia ENT: no sore throat, no nodules  palpated in throat, no dysphagia/odynophagia, no hoarseness Cardiovascular: no CP/SOB/palpitations/leg swelling Respiratory: no cough/SOB Gastrointestinal: no N/V/D/C Musculoskeletal: no muscle/joint aches Skin: no rashes Neurological: no tremors/numbness/tingling/dizziness Psychiatric: no depression/anxiety  Objective:    BP 114/78 mmHg  Pulse 66  Ht 5\' 3"  (1.6 m)  Wt 201 lb (91.173 kg)  BMI 35.61 kg/m2  SpO2 96%  LMP 01/06/2011  Wt Readings from Last 3 Encounters:  03/12/15 201 lb (91.173 kg)   01/15/15 201 lb (91.173 kg)  11/19/14 199 lb (90.266 kg)    Physical Exam    Constitutional: overweight, in NAD Eyes: PERRLA, EOMI, no exophthalmos ENT: moist mucous membranes, no thyromegaly, no cervical lymphadenopathy Cardiovascular: RRR, No MRG Respiratory: CTA B Gastrointestinal: abdomen soft, NT, ND, BS+ Musculoskeletal: no deformities, strength intact in all 4 Skin: moist, warm, no rashes Neurological: no tremor with outstretched hands, DTR normal in all 4  Results for orders placed or performed in visit on 02/23/15  TSH  Result Value Ref Range   TSH 1.61 mIU/L  T4, free  Result Value Ref Range   Free T4 1.2 0.8 - 1.8 ng/dL   Complete Blood Count (Most recent): Lab Results  Component Value Date   WBC 8.5 04/08/2014   HGB 14.2 04/08/2014   HCT 43.3 04/08/2014   MCV 95.2 04/08/2014   PLT 207 04/08/2014   Chemistry (most recent): Lab Results  Component Value Date   NA 141 04/08/2014   K 4.0 04/08/2014   CL 104 04/08/2014   CO2 28 04/08/2014   BUN 15 04/08/2014   CREATININE 0.76 04/08/2014   Lipid Panel     Component Value Date/Time   CHOL 147 03/25/2013 0920   TRIG 349* 03/25/2013 0920   HDL 23* 03/25/2013 0920   CHOLHDL 6.4 03/25/2013 0920   VLDL 70* 03/25/2013 0920   LDLCALC 54 03/25/2013 0920      Assessment & Plan:   1. Hypothyroidism due to medicaments and other exogenous substances:  She has Grave's disease s/p RAI therapy on November 08, 2013.  Heart thyroid function tests are consistent with appropriate replacement. I advised her to continue on Synthroid  200 mcg po qam.   - We discussed about correct intake of levothyroxine, at fasting, with water, separated by at least 30 minutes from breakfast, and separated by more than 4 hours from calcium, iron, multivitamins, acid reflux medications (PPIs). -Patient is made aware of the fact that thyroid hormone replacement is needed for life, dose to be adjusted by periodic monitoring of thyroid  function tests.  I advised patient to maintain close follow up with their PCP for primary care needs. Follow up plan: Return in about 6 months (around 09/12/2015) for underactive thyroid, follow up with pre-visit labs.  Glade Lloyd, MD Phone: [phone]  Fax: [phone]   03/12/2015, 4:39 PM

## 2015-03-24 ENCOUNTER — Other Ambulatory Visit: Payer: Self-pay

## 2015-03-24 MED ORDER — LEVOTHYROXINE SODIUM 200 MCG PO TABS: 200.0000 ug | ORAL_TABLET | Freq: Every day | ORAL | Status: DC

## 2015-05-18 ENCOUNTER — Telehealth: Payer: Self-pay | Admitting: "Endocrinology

## 2015-05-18 NOTE — Telephone Encounter (Signed)
her synthryoid is no long covered by insurance pls call back with what you change it to

## 2015-05-19 ENCOUNTER — Other Ambulatory Visit: Payer: Self-pay | Admitting: "Endocrinology

## 2015-05-19 MED ORDER — LEVOTHYROXINE SODIUM 200 MCG PO TABS: 200.0000 ug | ORAL_TABLET | Freq: Every day | ORAL | Status: DC

## 2015-05-19 NOTE — Telephone Encounter (Signed)
I sent generic levothyroxine to the pharmacy.

## 2015-05-20 ENCOUNTER — Other Ambulatory Visit: Payer: Self-pay

## 2015-05-20 MED ORDER — LEVOTHYROXINE SODIUM 200 MCG PO TABS: 200.0000 ug | ORAL_TABLET | Freq: Every day | ORAL | Status: DC

## 2015-05-24 ENCOUNTER — Other Ambulatory Visit (HOSPITAL_COMMUNITY): Payer: Self-pay | Admitting: Hematology & Oncology

## 2015-06-09 ENCOUNTER — Other Ambulatory Visit (HOSPITAL_COMMUNITY): Payer: Self-pay | Admitting: Oncology

## 2015-06-09 DIAGNOSIS — C50411 Malignant neoplasm of upper-outer quadrant of right female breast: Secondary | ICD-10-CM

## 2015-06-10 ENCOUNTER — Telehealth: Payer: Self-pay

## 2015-06-10 NOTE — Telephone Encounter (Signed)
There is no reason to believe that levothyroxine did not work for her. If we use it on the right dose, he should continue to work for her. If her insurance company does not pay for Synthroid, her only choice is  to stay on levothyroxine.

## 2015-06-10 NOTE — Telephone Encounter (Signed)
NOTIFIED PT AND SHE REALLY WANTS TO STAY ON THE SYNTHROID. SHE STATES SHE WILL GET THE PHARMACY TO FAX HER PRESCRIPTION INSURANCE INFO SO WE CAN TRY TO OBTAIN PA.

## 2015-06-10 NOTE — Telephone Encounter (Signed)
Pt left message with front desk that her insurance company will not pay for Synthroid and the Levothyroxine did not work. She is requesting a new prescription.

## 2015-06-16 ENCOUNTER — Ambulatory Visit (HOSPITAL_COMMUNITY)
Admission: RE | Admit: 2015-06-16 | Discharge: 2015-06-16 | Disposition: A | Discharge status: Home / Self Care | Payer: 59 | Source: Ambulatory Visit | Attending: Oncology | Admitting: Oncology

## 2015-06-16 DIAGNOSIS — C50411 Malignant neoplasm of upper-outer quadrant of right female breast: Secondary | ICD-10-CM | POA: Diagnosis not present

## 2015-07-06 ENCOUNTER — Other Ambulatory Visit: Payer: Self-pay

## 2015-07-06 MED ORDER — LEVOTHYROXINE SODIUM 200 MCG PO TABS: 200.0000 ug | ORAL_TABLET | Freq: Every day | ORAL | Status: DC

## 2015-07-14 ENCOUNTER — Telehealth (HOSPITAL_COMMUNITY): Payer: Self-pay | Admitting: *Deleted

## 2015-07-15 ENCOUNTER — Other Ambulatory Visit (HOSPITAL_COMMUNITY): Payer: Self-pay | Admitting: Oncology

## 2015-07-15 DIAGNOSIS — C50411 Malignant neoplasm of upper-outer quadrant of right female breast: Secondary | ICD-10-CM

## 2015-07-16 ENCOUNTER — Other Ambulatory Visit (HOSPITAL_COMMUNITY): Payer: 59

## 2015-07-16 ENCOUNTER — Ambulatory Visit (HOSPITAL_COMMUNITY): Payer: BLUE CROSS/BLUE SHIELD | Admitting: Oncology

## 2015-07-16 ENCOUNTER — Other Ambulatory Visit (HOSPITAL_COMMUNITY): Payer: BLUE CROSS/BLUE SHIELD

## 2015-07-17 ENCOUNTER — Ambulatory Visit (HOSPITAL_COMMUNITY): Payer: 59 | Admitting: Oncology

## 2015-07-17 ENCOUNTER — Other Ambulatory Visit (HOSPITAL_COMMUNITY): Payer: 59

## 2015-07-29 ENCOUNTER — Ambulatory Visit: Payer: 59 | Admitting: "Endocrinology

## 2015-08-19 ENCOUNTER — Other Ambulatory Visit (HOSPITAL_COMMUNITY): Payer: Self-pay | Admitting: Hematology & Oncology

## 2015-08-27 ENCOUNTER — Encounter (HOSPITAL_COMMUNITY): Payer: 59 | Attending: Oncology

## 2015-08-27 ENCOUNTER — Telehealth (HOSPITAL_COMMUNITY): Payer: Self-pay | Admitting: *Deleted

## 2015-08-27 ENCOUNTER — Other Ambulatory Visit (HOSPITAL_COMMUNITY)
Admission: RE | Admit: 2015-08-27 | Discharge: 2015-08-27 | Disposition: A | Discharge status: Home / Self Care | Payer: 59 | Source: Ambulatory Visit | Attending: "Endocrinology | Admitting: "Endocrinology

## 2015-08-27 DIAGNOSIS — E032 Hypothyroidism due to medicaments and other exogenous substances: Secondary | ICD-10-CM | POA: Insufficient documentation

## 2015-08-27 DIAGNOSIS — C50411 Malignant neoplasm of upper-outer quadrant of right female breast: Secondary | ICD-10-CM | POA: Insufficient documentation

## 2015-08-27 LAB — CBC WITH DIFFERENTIAL/PLATELET
BASOS ABS: 0 10*3/uL (ref 0.0–0.1)
BASOS PCT: 0 %
Eosinophils Absolute: 0.1 10*3/uL (ref 0.0–0.7)
Eosinophils Relative: 1 %
HEMATOCRIT: 43.8 % (ref 36.0–46.0)
HEMOGLOBIN: 14.3 g/dL (ref 12.0–15.0)
LYMPHS PCT: 28 %
Lymphs Abs: 2.4 10*3/uL (ref 0.7–4.0)
MCH: 31.1 pg (ref 26.0–34.0)
MCHC: 32.6 g/dL (ref 30.0–36.0)
MCV: 95.2 fL (ref 78.0–100.0)
MONO ABS: 0.5 10*3/uL (ref 0.1–1.0)
MONOS PCT: 5 %
NEUTROS ABS: 5.6 10*3/uL (ref 1.7–7.7)
NEUTROS PCT: 66 %
Platelets: 223 10*3/uL (ref 150–400)
RBC: 4.6 MIL/uL (ref 3.87–5.11)
RDW: 13.6 % (ref 11.5–15.5)
WBC: 8.6 10*3/uL (ref 4.0–10.5)

## 2015-08-27 LAB — COMPREHENSIVE METABOLIC PANEL
ALBUMIN: 3.7 g/dL (ref 3.5–5.0)
ALK PHOS: 79 U/L (ref 38–126)
ALT: 20 U/L (ref 14–54)
AST: 24 U/L (ref 15–41)
Anion gap: 6 (ref 5–15)
BILIRUBIN TOTAL: 0.1 mg/dL — AB (ref 0.3–1.2)
BUN: 11 mg/dL (ref 6–20)
CALCIUM: 8.5 mg/dL — AB (ref 8.9–10.3)
CO2: 26 mmol/L (ref 22–32)
Chloride: 104 mmol/L (ref 101–111)
Creatinine, Ser: 0.66 mg/dL (ref 0.44–1.00)
GFR calc Af Amer: >60 mL/min (ref >60)
GLUCOSE: 113 mg/dL — AB (ref 65–99)
POTASSIUM: 4.3 mmol/L (ref 3.5–5.1)
Sodium: 136 mmol/L (ref 135–145)
TOTAL PROTEIN: 7.2 g/dL (ref 6.5–8.1)

## 2015-08-27 LAB — T4, FREE: Free T4: 1.2 ng/dL — ABNORMAL HIGH (ref 0.61–1.12)

## 2015-08-27 LAB — TSH: TSH: 0.328 u[IU]/mL — AB (ref 0.350–4.500)

## 2015-08-27 NOTE — Telephone Encounter (Signed)
Pt aware labs are good 

## 2015-08-27 NOTE — Telephone Encounter (Signed)
-----   Message from [facility], PA-C sent at 08/27/2015  4:00 PM EDT ----- Kermit Balo

## 2015-08-28 LAB — FOLLICLE STIMULATING HORMONE: FSH: 16.3 m[IU]/mL

## 2015-08-28 LAB — ESTRADIOL: ESTRADIOL: 35.5 pg/mL

## 2015-09-09 ENCOUNTER — Encounter: Payer: Self-pay | Admitting: "Endocrinology

## 2015-09-09 ENCOUNTER — Ambulatory Visit (INDEPENDENT_AMBULATORY_CARE_PROVIDER_SITE_OTHER): Payer: 59 | Admitting: "Endocrinology

## 2015-09-09 VITALS — BP 105/69 | HR 95 | Ht 63.0 in | Wt 198.0 lb

## 2015-09-09 DIAGNOSIS — E890 Postprocedural hypothyroidism: Secondary | ICD-10-CM

## 2015-09-09 NOTE — Progress Notes (Signed)
Subjective:    Patient ID: Sheila Jefferson, female    DOB: Jul 27, 1965,    Past Medical History:  Diagnosis Date  . Bulging lumbar disc   . Cancer of upper-outer quadrant of female breast ([place]) 01/06/2011   right breast  . GERD (gastroesophageal reflux disease)    Past Surgical History:  Procedure Laterality Date  . BREAST LUMPECTOMY  02/11/2011   right breast and axillary dissection  . BREAST REDUCTION SURGERY  1997  . CHOLECYSTECTOMY  2003  . COLONOSCOPY    . pilonidal cyst     1980-81-83  . PORT-A-CATH REMOVAL  11/10/2011   Procedure: MINOR REMOVAL PORT-A-CATH;  Surgeon: Haywood Lasso, MD;  Location: [place];  Service: General;  Laterality: Left;  . PORTACATH PLACEMENT  02/11/2011   Procedure: INSERTION PORT-A-CATH;  Surgeon: Haywood Lasso, MD;  Location: [place];  Service: General;  Laterality: Left;  INSERTION PORT-A-CATH LEFT SUBCLAVIAN  . TUBAL LIGATION  2003  . UTERINE FIBROID SURGERY  09/30/2011   removal 8 polyps - Dr Sheila Jefferson   Social History   Social History  . Marital status: Married    Spouse name: N/A  . Number of children: N/A  . Years of education: N/A   Social History Main Topics  . Smoking status: Current Every Day Smoker    Packs/day: 0.50    Years: 30.00    Types: Cigarettes  . Smokeless tobacco: Never Used  . Alcohol use No  . Drug use:     Types: Hydrocodone  . Sexual activity: Yes   Other Topics Concern  . None   Social History Narrative  . None   Outpatient Encounter Prescriptions as of 09/09/2015  Medication Sig  . ALPRAZolam (XANAX) 0.5 MG tablet Take 0.5 mg by mouth daily as needed. For sleep  . levothyroxine (SYNTHROID, LEVOTHROID) 200 MCG tablet Take 1 tablet (200 mcg total) by mouth daily.  Marland Kitchen oxyCODONE (OXY IR/ROXICODONE) 5 MG immediate release tablet Take 5 mg by mouth 3 (three) times daily as needed.   . tamoxifen (NOLVADEX) 20 MG tablet TAKE 1 TABLET BY MOUTH EVERY DAY   No facility-administered encounter  medications on file as of 09/09/2015.    ALLERGIES: No Known Allergies VACCINATION STATUS:  There is no immunization history on file for this patient.  HPI  Sheila Jefferson is a 50- yr-old female patient with medical history as above.  She is here for a f/u after RAI ablative therapy for GD on Novemeber 6, 2015.  Her most recent TFT show  Thyroid function tests are consistent with appropriate replacement. She is compliant, has no new complaints. Pt has family history of thyroid dysfunction in her mother and sister -hyperthyroidism. She denies personal history of goiter. -Due to cost, her Synthroid was switched to levothyroxine since last visit.    Review of Systems Constitutional: no weight gain/loss, no fatigue, no subjective hyperthermia/hypothermia Eyes: no blurry vision, no xerophthalmia ENT: no sore throat, no nodules palpated in throat, no dysphagia/odynophagia, no hoarseness Cardiovascular: no CP/SOB/palpitations/leg swelling Respiratory: no cough/SOB Gastrointestinal: no N/V/D/C Musculoskeletal: no muscle/joint aches Skin: no rashes Neurological: no tremors/numbness/tingling/dizziness Psychiatric: no depression/anxiety  Objective:    BP 105/69   Pulse 95   Ht 5\' 3"  (1.6 m)   Wt 198 lb (89.8 kg)   LMP 01/06/2011   BMI 35.07 kg/m   Wt Readings from Last 3 Encounters:  09/09/15 198 lb (89.8 kg)  03/12/15 201 lb (91.2 kg)  01/15/15 201  lb (91.2 kg)    Physical Exam    Constitutional: overweight, in NAD Eyes: PERRLA, EOMI, no exophthalmos ENT: moist mucous membranes, no thyromegaly, no cervical lymphadenopathy Cardiovascular: RRR, No MRG Respiratory: CTA B Gastrointestinal: abdomen soft, NT, ND, BS+ Musculoskeletal: no deformities, strength intact in all 4 Skin: moist, warm, no rashes Neurological: no tremor with outstretched hands, DTR normal in all 4  Results for orders placed or performed during the hospital encounter of 08/27/15  TSH  Result Value Ref Range    TSH 0.328 (L) 0.350 - 4.500 uIU/mL  T4, free  Result Value Ref Range   Free T4 1.20 (H) 0.61 - 1.12 ng/dL   Complete Blood Count (Most recent): Lab Results  Component Value Date   WBC 8.6 08/27/2015   HGB 14.3 08/27/2015   HCT 43.8 08/27/2015   MCV 95.2 08/27/2015   PLT 223 08/27/2015   Chemistry (most recent): Lab Results  Component Value Date   NA 136 08/27/2015   K 4.3 08/27/2015   CL 104 08/27/2015   CO2 26 08/27/2015   BUN 11 08/27/2015   CREATININE 0.66 08/27/2015   Lipid Panel     Component Value Date/Time   CHOL 147 03/25/2013 0920   TRIG 349 (H) 03/25/2013 0920   HDL 23 (L) 03/25/2013 0920   CHOLHDL 6.4 03/25/2013 0920   VLDL 70 (H) 03/25/2013 0920   LDLCALC 54 03/25/2013 0920      Assessment & Plan:   1. Hypothyroidism due to medicaments and other exogenous substances:  She has Grave's disease s/p RAI therapy on November 08, 2013.  Heart thyroid function tests are consistent with appropriate replacement. I advised her to continue on Levothyroxine  200 mcg po qam. -She did not afford the brand Synthroid.   - We discussed about correct intake of levothyroxine, at fasting, with water, separated by at least 30 minutes from breakfast, and separated by more than 4 hours from calcium, iron, multivitamins, acid reflux medications (PPIs). -Patient is made aware of the fact that thyroid hormone replacement is needed for life, dose to be adjusted by periodic monitoring of thyroid function tests.  I advised patient to maintain close follow up with their PCP for primary care needs. Follow up plan: Return in about 6 months (around 03/08/2016) for follow up with pre-visit labs.  Glade Lloyd, MD Phone: [phone]  Fax: [phone]   09/09/2015, 4:20 PM

## 2015-09-11 ENCOUNTER — Telehealth (HOSPITAL_COMMUNITY): Payer: Self-pay | Admitting: Emergency Medicine

## 2015-09-11 ENCOUNTER — Ambulatory Visit (HOSPITAL_COMMUNITY): Payer: 59 | Admitting: Hematology & Oncology

## 2015-09-11 NOTE — Telephone Encounter (Signed)
Pt wanted labs results.  Pt notified that she was not in menopause.

## 2015-10-26 ENCOUNTER — Ambulatory Visit (HOSPITAL_COMMUNITY): Payer: 59 | Admitting: Oncology

## 2015-11-20 ENCOUNTER — Ambulatory Visit (HOSPITAL_COMMUNITY): Payer: 59 | Admitting: Oncology

## 2015-11-30 ENCOUNTER — Encounter (HOSPITAL_COMMUNITY): Payer: 59 | Attending: Oncology | Admitting: Oncology

## 2015-11-30 ENCOUNTER — Encounter (HOSPITAL_COMMUNITY): Payer: Self-pay | Admitting: Emergency Medicine

## 2015-11-30 ENCOUNTER — Encounter (HOSPITAL_COMMUNITY): Payer: Self-pay | Admitting: Oncology

## 2015-11-30 ENCOUNTER — Other Ambulatory Visit (HOSPITAL_COMMUNITY): Payer: Self-pay | Admitting: Emergency Medicine

## 2015-11-30 VITALS — BP 130/60 | HR 98 | Temp 98.2°F | Resp 20 | Wt 202.4 lb

## 2015-11-30 DIAGNOSIS — Z170 Estrogen receptor positive status [ER+]: Secondary | ICD-10-CM

## 2015-11-30 DIAGNOSIS — C773 Secondary and unspecified malignant neoplasm of axilla and upper limb lymph nodes: Secondary | ICD-10-CM

## 2015-11-30 DIAGNOSIS — C50411 Malignant neoplasm of upper-outer quadrant of right female breast: Secondary | ICD-10-CM

## 2015-11-30 NOTE — Patient Instructions (Addendum)
[facility] Discharge Instructions  RECOMMENDATIONS MADE BY THE CONSULTANT AND ANY TEST RESULTS WILL BE SENT TO YOUR REFERRING PHYSICIAN.  You were seen today by Tom Kefalas PA-C. Return in 6 months for labs and follow up.    Thank you for choosing [facility] at [facility] to provide your oncology and hematology care.  To afford each patient quality time with our provider, please arrive at least 15 minutes before your scheduled appointment time.   Beginning January 23rd 2017 lab work for the Cancer Center will be done in the  Main lab at [facility] on 1st floor. If you have a lab appointment with the Cancer Center please come in thru the  Main Entrance and check in at the main information desk  You need to re-schedule your appointment should you arrive 10 or more minutes late.  We strive to give you quality time with our providers, and arriving late affects you and other patients whose appointments are after yours.  Also, if you no show three or more times for appointments you may be dismissed from the clinic at the providers discretion.     Again, thank you for choosing [facility].  Our [place] is that these requests will decrease the amount of time that you wait before being seen by our physicians.       _____________________________________________________________  Should you have questions after your visit to [facility], please contact our office at [phone] between the hours of 8:30 a.m. and 4:30 p.m.  Voicemails left after 4:30 p.m. will not be returned until the following business day.  For prescription refill requests, have your pharmacy contact our office.         Resources For Cancer Patients and their Caregivers ? American Cancer Society: Can assist with transportation, wigs, general needs, runs Look Good Feel Better.        1-888-227-6333 ? Cancer Care: Provides financial  assistance, online support groups, medication/co-pay assistance.  1-800-813-[place] (4673) ? [facility] Assists [place] cancer patients and their families through emotional , educational and financial support.  336-427-4357 ? [place] DSS Where to apply for food stamps, Medicaid and utility assistance. 336-342-1394 ? [facility]: Transportation to medical appointments. 336-347-2287 ? Social Security Administration: May apply for disability if have a Stage IV cancer. 336-342-7796 1-800-772-1213 ? [place] Aging, Disability and Transit Services: Assists with nutrition, care and transit needs. 336-349-2343  Cancer Center Support Programs: @10RELATIVEDAYS@ > Cancer Support Group  2nd Tuesday of the month 1pm-2pm, Journey Room  > Creative Journey  3rd Tuesday of the month 1130am-1pm, Journey Room  > Look Good Feel Better  1st Wednesday of the month 10am-12 noon, Journey Room (Call American Cancer Society to register 1-800-395-5775)    

## 2015-11-30 NOTE — Progress Notes (Signed)
Sheila Percy, MD [place] [address]  Malignant neoplasm of upper-outer quadrant of right breast in female, estrogen receptor positive ([place]) - Plan: CBC with Differential, Comprehensive metabolic panel, Luteinizing hormone, Follicle stimulating hormone, Estradiol  CURRENT THERAPY: Tamoxifen 20 mg daily and surveillance per NCCN guidelines  INTERVAL HISTORY: [name] 50 y.o. female returns for followup of Stage II, grade 2, infiltrating ductal carcinoma the right breast, 2.5 cm in size with one positive node out of 15 and one additional high axillary node given her total 16 nodes. HER-2/neu negative, ER +90%, PR +80%, Ki-67 marker high at 71%, status post dose dense Adriamycin and Cytoxan for 4 cycles followed by Taxotere for 4 cycles. She is now on Tamoxifen 20 mg daily beginning on 10/21/2011.    Cancer of upper-outer quadrant of female breast, right   01/06/2011 Initial Diagnosis    Cancer of upper-outer quadrant of female breast, right      01/17/2011 Initial Biopsy    Right breast and axilla biopsy demonstrating invasive ductal carcinoma with metastatic disease in 1/1 lymph nodes.  ER 90%, PR 80%, Ki-67 71%, and Her2 negative by FISH.      02/11/2011 Definitive Surgery    Right breast lumpectomy with right regional lymph node dissection- invasive ductal carcinoma, grade II, 2.5 cm.  No LVI.  CLear margins. 1/16 lymph nodes positive for metastatic ductal carcinoma.        03/01/2011 - 04/12/2011 Chemotherapy    AC x 4 cycles      04/26/2011 - 06/21/2011 Chemotherapy    Taxotere x 4 cycles      07/15/2011 - 08/26/2011 Radiation Therapy    Dr. Pablo Ledger at [facility]      08/29/2011 -  Anti-estrogen oral therapy    Tamoxifen 20 mg daily.       She expresses her typical complaints: 1. Weight gain 2. Lower extremity edema, resolves with diuretics and lower extremity elevation 3. Hot flashes- likely Tamoxifen-induced.  This interferes  with her QOL. 4. Fatigue 5. Leg cramps 6. Positional vertigo.  She has been given exercises by her primary care provider.  We discussed the role of BCI testing.  I am hesitant to pursue, but after discussion with the patient, she wants to learn this information.  If she would benefit from extended endocrine therapy, we will have to evaluate where we are with regards to her hormonal status given her complaints associated with Tamoxifen  I have reviewed the definition of menopause in accordance with the NCCN guidelines:  NCCN guidelines define menopause in breast cancer patients (3.2017): Clinical trials in breast cancer have utilized a variety of definitions of menopause. Menopause is generally the permanent cessation of menses, and as the term is utilized in breast cancer management includes a profound and permanent decrease in ovarian estrogen synthesis. Reasonable criteria for determining menopause including any of the following:  1. Prior bilateral oophorectomy  2. Age >/= 65 y  3. Age < 28 y and amenorrheic for 12 or more months in the absence of chemotherapy, tamoxifen, toremifene, or ovarian suppression and follicle-stimulating hormone (FSH) and estradiol in the postmenopausal range  4. If taking tamoxifen or toremifene, and age < 60 y, then [facility] and plasma estradiol level in postmenopausal ranges It is not possible to assign menopausal status to women who are receiving an [facility] agonist or antagonist. In women premenopausal at the beginning of adjuvant chemotherapy, amenorrhea is not  a reliable indicator of menopausal status as ovarian function may still be intact or resume despite anovulation/amenorrhea after chemotherapy. For these women with therapy induced amenorrhea, oophorectomy or serial measurement of FSH and/or estradiol are needed to ensure postmenopausal status if the use of aromatase inhibitors is considered as a component of endocrine therapy.   Review of Systems  Constitutional:  Positive for malaise/fatigue (chronic, stable). Negative for chills, fever and weight loss.       Hot flashes  HENT: Negative.   Eyes: Negative.  Negative for blurred vision and double vision.  Respiratory: Negative.   Cardiovascular: Positive for leg swelling (bilateral, improves with elevation). Negative for chest pain.  Gastrointestinal: Negative.  Negative for constipation, diarrhea, nausea and vomiting.  Genitourinary: Negative.   Musculoskeletal: Negative.   Skin: Negative.  Negative for rash.  Neurological: Positive for dizziness. Negative for weakness.  Endo/Heme/Allergies: Negative.  Does not bruise/bleed easily.  Psychiatric/Behavioral: Negative.     Past Medical History:  Diagnosis Date  . Bulging lumbar disc   . Cancer of upper-outer quadrant of female breast ([place]) 01/06/2011   right breast  . GERD (gastroesophageal reflux disease)     Past Surgical History:  Procedure Laterality Date  . BREAST LUMPECTOMY  02/11/2011   right breast and axillary dissection  . BREAST REDUCTION SURGERY  1997  . CHOLECYSTECTOMY  2003  . COLONOSCOPY    . pilonidal cyst     [id number]  . PORT-A-CATH REMOVAL  11/10/2011   Procedure: MINOR REMOVAL PORT-A-CATH;  Surgeon: Haywood Lasso, MD;  Location: [place];  Service: General;  Laterality: Left;  . PORTACATH PLACEMENT  02/11/2011   Procedure: INSERTION PORT-A-CATH;  Surgeon: Haywood Lasso, MD;  Location: [place];  Service: General;  Laterality: Left;  INSERTION PORT-A-CATH LEFT SUBCLAVIAN  . TUBAL LIGATION  2003  . UTERINE FIBROID SURGERY  09/30/2011   removal 8 polyps - Dr Derenda Mis    Family History  Problem Relation Age of Onset  . Cancer Maternal Aunt     breast  . Cancer Mother     vaginal  . Diabetes Father   . Cancer Sister     histocytosis    Social History   Social History  . Marital status: Married    Spouse name: N/A  . Number of children: N/A  . Years of education: N/A   Social History Main  Topics  . Smoking status: Current Every Day Smoker    Packs/day: 0.50    Years: 30.00    Types: Cigarettes  . Smokeless tobacco: Never Used  . Alcohol use No  . Drug use:     Types: Hydrocodone  . Sexual activity: Yes   Other Topics Concern  . None   Social History Narrative  . None     PHYSICAL EXAMINATION  ECOG PERFORMANCE STATUS: 1 - Symptomatic but completely ambulatory  Vitals:   11/30/15 0933  BP: 130/60  Pulse: 98  Resp: 20  Temp: 98.2 F (36.8 C)    GENERAL:alert, no distress, well nourished, well developed, comfortable, cooperative, flushed, obese, smiling and accompanied by her husband SKIN: skin color, texture, turgor are normal, no rashes or significant lesions HEAD: Normocephalic, No masses, lesions, tenderness or abnormalities EYES: normal, EOMI, Conjunctiva are pink and non-injected EARS: External ears normal OROPHARYNX:lips, buccal mucosa, and tongue normal and mucous membranes are moist  NECK: supple, no adenopathy, thyroid normal size, non-tender, without nodularity, trachea midline LYMPH:  no palpable lymphadenopathy BREAST:S/P right partial mastectomy.  Breasts appear normal, no suspicious masses, no skin or nipple changes or axillary nodes LUNGS: clear to auscultation and percussion HEART: regular rate & rhythm, no murmurs and no gallops ABDOMEN:abdomen soft, obese and normal bowel sounds BACK: Back symmetric, no curvature. EXTREMITIES:less then 2 second capillary refill, no joint deformities, effusion, or inflammation, no skin discoloration, no clubbing, no cyanosis  NEURO: alert & oriented x 3 with fluent speech, no focal motor/sensory deficits, gait normal   LABORATORY DATA: CBC    Component Value Date/Time   WBC 8.6 08/27/2015 1426   RBC 4.60 08/27/2015 1426   HGB 14.3 08/27/2015 1426   HCT 43.8 08/27/2015 1426   PLT 223 08/27/2015 1426   MCV 95.2 08/27/2015 1426   MCH 31.1 08/27/2015 1426   MCHC 32.6 08/27/2015 1426   RDW 13.6  08/27/2015 1426   LYMPHSABS 2.4 08/27/2015 1426   MONOABS 0.5 08/27/2015 1426   EOSABS 0.1 08/27/2015 1426   BASOSABS 0.0 08/27/2015 1426      Chemistry      Component Value Date/Time   NA 136 08/27/2015 1426   K 4.3 08/27/2015 1426   CL 104 08/27/2015 1426   CO2 26 08/27/2015 1426   BUN 11 08/27/2015 1426   CREATININE 0.66 08/27/2015 1426      Component Value Date/Time   CALCIUM 8.5 (L) 08/27/2015 1426   ALKPHOS 79 08/27/2015 1426   AST 24 08/27/2015 1426   ALT 20 08/27/2015 1426   BILITOT 0.1 (L) 08/27/2015 1426        PENDING LABS:   RADIOGRAPHIC STUDIES:  No results found.   PATHOLOGY:    ASSESSMENT AND PLAN:  Cancer of upper-outer quadrant of female breast, right Stage II, grade 2, infiltrating ductal carcinoma the right breast, 2.5 cm in size with one positive node out of 15 and one additional high axillary node given her total 16 nodes. HER-2/neu negative, ER +90%, PR +80%, Ki-67 marker high at 71%, status post dose dense Adriamycin and Cytoxan for 4 cycles followed by Taxotere for 4 cycles. She is now on Tamoxifen 20 mg daily beginning on 10/21/2011 which she will take for 5-10 years +/- switch to AI when proven to be post-menopausal.  Oncology history is up-to-date.  Labs on 08/27/2015: CBC diff, CMET, FSH, estradiol.  I personally reviewed and went over laboratory results with the patient.  The results are noted within this dictation.  By hormonal status, she is not post-menopausal.  Next mammogram is due in June 2018.  Continue follow-up with Dr. Dorris Fetch, Endocrinology, as directed with compliance with Synthroid.  Labs in 6 months: CBC diff, CMET, estradiol, FSH, LH.  Once labs demonstrate post-menopausal status, discussion regarding transition to AI therapy will take place.  If a change to AI therapy occurs, she will need a baseline bone density.  Return in 6 months for follow-up.   ORDERS PLACED FOR THIS ENCOUNTER: Orders Placed This Encounter    Procedures  . CBC with Differential  . Comprehensive metabolic panel  . Luteinizing hormone  . Follicle stimulating hormone  . Estradiol    MEDICATIONS PRESCRIBED THIS ENCOUNTER: Meds ordered this encounter  Medications  . meclizine (ANTIVERT) 25 MG tablet  . ALPRAZolam (XANAX) 1 MG tablet    THERAPY PLAN:  NCCN guidelines recommends the following surveillance for invasive breast cancer (2.2017):  A. History and Physical exam 1-4 times per year as clinically appropriate for 5 years, then annually.  B. Periodic screening for changes in family history and referral to  genetics counseling as indicated  C. Educate, monitor, and refer to lymphedema management.  D. Mammography every 12 months  E. Routine imaging of reconstructed breast is not indicated.  F. In the absence of clinical signs and symptoms suggestive of recurrent disease, there is no indication for laboratory or imaging studies for metastases screening.  G. Women on Tamoxifen: annual gynecologic assessment every 12 months if uterus is present.  H. Women on aromatase inhibitor or who experience ovarian failure secondary to treatment should have monitoring of bone health with a bone mineral density determination at baseline and periodically thereafter.  I. Assess and encourage adherence to adjuvant endocrine therapy.  J. Evidence suggests that active lifestyle, healthy diet, limited alcohol intake, and achieving and maintaining an ideal body weight (20-25 BMI) may lead to optimal breast cancer outcomes.  NCCN guidelines define menopause in breast cancer patients (3.2017): Clinical trials in breast cancer have utilized a variety of definitions of menopause. Menopause is generally the permanent cessation of menses, and as the term is utilized in breast cancer management includes a profound and permanent decrease in ovarian estrogen synthesis. Reasonable criteria for determining menopause including any of the following:  1. Prior  bilateral oophorectomy  2. Age >/= 45 y  3. Age < 71 y and amenorrheic for 12 or more months in the absence of chemotherapy, tamoxifen, toremifene, or ovarian suppression and follicle-stimulating hormone (FSH) and estradiol in the postmenopausal range  4. If taking tamoxifen or toremifene, and age < 50 y, then [facility] and plasma estradiol level in postmenopausal ranges It is not possible to assign menopausal status to women who are receiving an [facility] agonist or antagonist. In women premenopausal at the beginning of adjuvant chemotherapy, amenorrhea is not a reliable indicator of menopausal status as ovarian function may still be intact or resume despite anovulation/amenorrhea after chemotherapy. For these women with therapy induced amenorrhea, oophorectomy or serial measurement of FSH and/or estradiol are needed to ensure postmenopausal status if the use of aromatase inhibitors is considered as a component of endocrine therapy.   All questions were answered. The patient knows to call the clinic with any problems, questions or concerns. We can certainly see the patient much sooner if necessary.  Patient and plan discussed with Dr. Ancil Linsey and she is in agreement with the aforementioned.   This note is electronically signed by: Doy Mince 11/30/2015 1:46 PM

## 2015-11-30 NOTE — Assessment & Plan Note (Addendum)
Stage II, grade 2, infiltrating ductal carcinoma the right breast, 2.5 cm in size with one positive node out of 15 and one additional high axillary node given her total 16 nodes. HER-2/neu negative, ER +90%, PR +80%, Ki-67 marker high at 71%, status post dose dense Adriamycin and Cytoxan for 4 cycles followed by Taxotere for 4 cycles. She is now on Tamoxifen 20 mg daily beginning on 10/21/2011 which she will take for 5-10 years +/- switch to AI when proven to be post-menopausal.  Oncology history is up-to-date.  Labs on 08/27/2015: CBC diff, CMET, FSH, estradiol.  I personally reviewed and went over laboratory results with the patient.  The results are noted within this dictation.  By hormonal status, she is not post-menopausal.  We discussed the role of BCI testing.  She is agreeable to this.  I am concerned about her ability to tolerate anti-endocrine therapy past 5 years.  Given that her Tamoxifen-related complaints are typically categorical and therefore can occur with AI therapy, I am not sure how well she will tolerate ongoing endocrine manipulation.  Next mammogram is due in June 2018.  Continue follow-up with Dr. Dorris Fetch, Endocrinology, as directed with compliance with Synthroid.  Labs in 6 months: CBC diff, CMET, estradiol, FSH.  Once labs demonstrate post-menopausal status, discussion regarding transition to AI therapy will take place.  If a change to AI therapy occurs, she will need a baseline bone density.  Despite being amenorrheic since 2013, she has been on Tamoxifen, therefore, we must rely on hormonal status ([place] and estradiol) to determine menopausal status.  Return in 6 months for follow-up.

## 2015-11-30 NOTE — Progress Notes (Signed)
Breast cancer index filled out and faxed.  Confirmation fox received.

## 2015-12-04 ENCOUNTER — Telehealth (HOSPITAL_COMMUNITY): Payer: Self-pay | Admitting: *Deleted

## 2015-12-04 NOTE — Telephone Encounter (Signed)
Will see what Angie needs to do about this.  Robynn Pane, PA-C 12/04/2015 4:40 PM

## 2015-12-04 NOTE — Telephone Encounter (Signed)
Patient returned call and stated the special test you talked about with her has to have prior authorization first. If this is not obtained, she will not be able to afford the test.

## 2015-12-04 NOTE — Telephone Encounter (Signed)
Called patient at home and cell and was unable to reach. Left message on her cell phone.

## 2015-12-08 ENCOUNTER — Telehealth (HOSPITAL_COMMUNITY): Payer: Self-pay | Admitting: Emergency Medicine

## 2015-12-08 NOTE — Telephone Encounter (Signed)
I called [facility] to see about prior-authorization.  They stated that they had given the payment options to the patient and awaiting a call back.  Stated that [facility] could submit a prior authorization or have the clinic do it but as explained to [facility] that this is not guarantee for payment.  I called [employer] to try to start a prior-authorization and they said that there was no authorization needed.  Spoke with Arissa and told her this information.  She verbalizes understanding.

## 2016-02-03 ENCOUNTER — Telehealth (HOSPITAL_COMMUNITY): Payer: Self-pay | Admitting: Emergency Medicine

## 2016-02-03 NOTE — Telephone Encounter (Signed)
Called to check on BCI, [facility] states that pt wanted to wait until April because she had not met her deductible yet.

## 2016-02-05 ENCOUNTER — Telehealth (HOSPITAL_COMMUNITY): Payer: Self-pay

## 2016-02-05 NOTE — Telephone Encounter (Signed)
Received communication via fax from [facility] that patient declined to proceed with BCI test. Since patient declined to proceed the test will not be performed and no results will be issued. Kirby Crigler, PA-C, notified of above.

## 2016-02-16 ENCOUNTER — Other Ambulatory Visit (HOSPITAL_COMMUNITY): Payer: Self-pay | Admitting: Oncology

## 2016-03-09 ENCOUNTER — Ambulatory Visit: Payer: 59 | Admitting: "Endocrinology

## 2016-03-25 DIAGNOSIS — Z720 Tobacco use: Secondary | ICD-10-CM | POA: Diagnosis not present

## 2016-03-25 DIAGNOSIS — E0590 Thyrotoxicosis, unspecified without thyrotoxic crisis or storm: Secondary | ICD-10-CM | POA: Diagnosis not present

## 2016-03-25 DIAGNOSIS — E039 Hypothyroidism, unspecified: Secondary | ICD-10-CM | POA: Diagnosis not present

## 2016-03-25 DIAGNOSIS — C50011 Malignant neoplasm of nipple and areola, right female breast: Secondary | ICD-10-CM | POA: Diagnosis not present

## 2016-03-25 LAB — TSH: TSH: 0.75 u[IU]/mL (ref 0.41–5.90)

## 2016-04-05 DIAGNOSIS — Z720 Tobacco use: Secondary | ICD-10-CM | POA: Diagnosis not present

## 2016-04-05 DIAGNOSIS — C50011 Malignant neoplasm of nipple and areola, right female breast: Secondary | ICD-10-CM | POA: Diagnosis not present

## 2016-04-05 DIAGNOSIS — E039 Hypothyroidism, unspecified: Secondary | ICD-10-CM | POA: Diagnosis not present

## 2016-04-20 ENCOUNTER — Ambulatory Visit: Payer: 59 | Admitting: "Endocrinology

## 2016-05-03 ENCOUNTER — Encounter: Payer: Self-pay | Admitting: "Endocrinology

## 2016-05-03 ENCOUNTER — Ambulatory Visit (INDEPENDENT_AMBULATORY_CARE_PROVIDER_SITE_OTHER): Payer: 59 | Admitting: "Endocrinology

## 2016-05-03 VITALS — BP 132/84 | HR 52 | Ht 63.0 in | Wt 207.0 lb

## 2016-05-03 DIAGNOSIS — E890 Postprocedural hypothyroidism: Secondary | ICD-10-CM

## 2016-05-03 DIAGNOSIS — E559 Vitamin D deficiency, unspecified: Secondary | ICD-10-CM | POA: Diagnosis not present

## 2016-05-03 MED ORDER — VITAMIN D (ERGOCALCIFEROL) 1.25 MG (50000 UNIT) PO CAPS: 50000.0000 [IU] | ORAL_CAPSULE | ORAL | 0 refills | Status: DC

## 2016-05-03 MED ORDER — LEVOTHYROXINE SODIUM 200 MCG PO TABS: 200.0000 ug | ORAL_TABLET | Freq: Every day | ORAL | 1 refills | Status: DC

## 2016-05-03 NOTE — Progress Notes (Signed)
Subjective:    Patient ID: Sheila Jefferson, female    DOB: 1965-02-08,    Past Medical History:  Diagnosis Date  . Bulging lumbar disc   . Cancer of upper-outer quadrant of female breast ([place]) 01/06/2011   right breast  . GERD (gastroesophageal reflux disease)    Past Surgical History:  Procedure Laterality Date  . BREAST LUMPECTOMY  02/11/2011   right breast and axillary dissection  . BREAST REDUCTION SURGERY  1997  . CHOLECYSTECTOMY  2003  . COLONOSCOPY    . pilonidal cyst     1980-81-83  . PORT-A-CATH REMOVAL  11/10/2011   Procedure: MINOR REMOVAL PORT-A-CATH;  Surgeon: Haywood Lasso, MD;  Location: [place];  Service: General;  Laterality: Left;  . PORTACATH PLACEMENT  02/11/2011   Procedure: INSERTION PORT-A-CATH;  Surgeon: Haywood Lasso, MD;  Location: [place];  Service: General;  Laterality: Left;  INSERTION PORT-A-CATH LEFT SUBCLAVIAN  . TUBAL LIGATION  2003  . UTERINE FIBROID SURGERY  09/30/2011   removal 8 polyps - Dr Derenda Mis   Social History   Social History  . Marital status: Married    Spouse name: N/A  . Number of children: N/A  . Years of education: N/A   Social History Main Topics  . Smoking status: Current Every Day Smoker    Packs/day: 0.50    Years: 30.00    Types: Cigarettes  . Smokeless tobacco: Never Used  . Alcohol use No  . Drug use: Yes    Types: Hydrocodone  . Sexual activity: Yes   Other Topics Concern  . None   Social History Narrative  . None   Outpatient Encounter Prescriptions as of 05/03/2016  Medication Sig  . ALPRAZolam (XANAX) 1 MG tablet   . levothyroxine (SYNTHROID, LEVOTHROID) 200 MCG tablet Take 1 tablet (200 mcg total) by mouth daily.  . meclizine (ANTIVERT) 25 MG tablet   . tamoxifen (NOLVADEX) 20 MG tablet TAKE 1 TABLET BY MOUTH DAILY  . Vitamin D, Ergocalciferol, (DRISDOL) 50000 units CAPS capsule Take 1 capsule (50,000 Units total) by mouth every 7 (seven) days.  . [DISCONTINUED] levothyroxine  (SYNTHROID, LEVOTHROID) 200 MCG tablet Take 1 tablet (200 mcg total) by mouth daily.  . [DISCONTINUED] oxyCODONE (OXY IR/ROXICODONE) 5 MG immediate release tablet Take 5 mg by mouth 3 (three) times daily as needed.    No facility-administered encounter medications on file as of 05/03/2016.    ALLERGIES: No Known Allergies VACCINATION STATUS:  There is no immunization history on file for this patient.  HPI  Sheila Jefferson is a 51- yr-old female patient with medical history as above.  She is here for a f/u after RAI ablative therapy for GD on Novemeber 6, 2015.  Her most recent Thyroid function tests show only PSH which is on target. -She is compliant, has no new complaints. Pt has family history of thyroid dysfunction in her mother and sister -hyperthyroidism. She denies personal history of goiter. -Due to cost, her Synthroid was switched to levothyroxine since last visit.    Review of Systems Constitutional: + weight gain, + fatigue, no subjective hyperthermia/hypothermia Eyes: no blurry vision, no xerophthalmia ENT: no sore throat, no nodules palpated in throat, no dysphagia/odynophagia, no hoarseness Cardiovascular: no CP/SOB/palpitations/leg swelling Respiratory: no cough/SOB Gastrointestinal: no N/V/D/C Musculoskeletal: no muscle/joint aches Skin: no rashes Neurological: no tremors/numbness/tingling/dizziness Psychiatric: no depression/anxiety  Objective:    BP 132/84   Pulse (!) 52   Ht 5\' 3"  (1.6 m)  Wt 207 lb (93.9 kg)   LMP 01/06/2011   BMI 36.67 kg/m   Wt Readings from Last 3 Encounters:  05/03/16 207 lb (93.9 kg)  11/30/15 202 lb 6.4 oz (91.8 kg)  09/09/15 198 lb (89.8 kg)    Physical Exam    Constitutional: overweight, in NAD Eyes: PERRLA, EOMI, no exophthalmos ENT: moist mucous membranes, no thyromegaly, no cervical lymphadenopathy Cardiovascular: RRR, No MRG Respiratory: CTA B Gastrointestinal: abdomen soft, NT, ND, BS+ Musculoskeletal: no  deformities, strength intact in all 4 Skin: moist, warm, no rashes Neurological: no tremor with outstretched hands, DTR normal in all 4  Results for orders placed or performed in visit on 05/03/16  TSH  Result Value Ref Range   TSH 0.75 0.41 - 5.90 uIU/mL   Complete Blood Count (Most recent): Lab Results  Component Value Date   WBC 8.6 08/27/2015   HGB 14.3 08/27/2015   HCT 43.8 08/27/2015   MCV 95.2 08/27/2015   PLT 223 08/27/2015   Chemistry (most recent): Lab Results  Component Value Date   NA 136 08/27/2015   K 4.3 08/27/2015   CL 104 08/27/2015   CO2 26 08/27/2015   BUN 11 08/27/2015   CREATININE 0.66 08/27/2015   Lipid Panel     Component Value Date/Time   CHOL 147 03/25/2013 0920   TRIG 349 (H) 03/25/2013 0920   HDL 23 (L) 03/25/2013 0920   CHOLHDL 6.4 03/25/2013 0920   VLDL 70 (H) 03/25/2013 0920   LDLCALC 54 03/25/2013 0920    25-hydroxy vitamin D  low at 13.   Assessment & Plan:   1. Hypothyroidism due to  RAI  She has Grave's disease s/p RAI therapy on November 08, 2013.  Her previsit labs showed only TSH which is on target. I advised her to continue on Levothyroxine  200 mcg po qam. -She did not afford the brand Synthroid.   - We discussed about correct intake of levothyroxine, at fasting, with water, separated by at least 30 minutes from breakfast, and separated by more than 4 hours from calcium, iron, multivitamins, acid reflux medications (PPIs). -Patient is made aware of the fact that thyroid hormone replacement is needed for life, dose to be adjusted by periodic monitoring of thyroid function tests.  2 . Vitamin D deficiency: Levels at 47. She would need larger dose prescription vitamin D. I will prescribed vitamin D 50,000 units weekly for the next 12 weeks followed by maintenance of vitamin D3 5000 units daily I advised patient to maintain close follow up with their PCP for primary care needs. Follow up plan: Return in about 6 months (around  11/03/2016) for follow up with pre-visit labs.  Glade Lloyd, MD Phone: [phone]  Fax: [phone]   05/03/2016, 3:56 PM

## 2016-05-31 ENCOUNTER — Other Ambulatory Visit (HOSPITAL_COMMUNITY): Payer: 59

## 2016-05-31 ENCOUNTER — Ambulatory Visit (HOSPITAL_COMMUNITY): Payer: 59 | Admitting: Oncology

## 2016-06-14 ENCOUNTER — Other Ambulatory Visit (HOSPITAL_COMMUNITY): Payer: Self-pay | Admitting: Oncology

## 2016-06-14 DIAGNOSIS — Z9889 Other specified postprocedural states: Secondary | ICD-10-CM

## 2016-06-20 ENCOUNTER — Ambulatory Visit (HOSPITAL_COMMUNITY): Payer: 59

## 2016-06-22 ENCOUNTER — Other Ambulatory Visit (HOSPITAL_COMMUNITY): Payer: Self-pay | Admitting: Oncology

## 2016-06-22 DIAGNOSIS — Z853 Personal history of malignant neoplasm of breast: Secondary | ICD-10-CM

## 2016-06-28 ENCOUNTER — Encounter (HOSPITAL_COMMUNITY): Payer: 59

## 2016-06-28 ENCOUNTER — Ambulatory Visit (HOSPITAL_COMMUNITY): Payer: 59

## 2016-06-28 ENCOUNTER — Other Ambulatory Visit: Payer: Self-pay | Admitting: "Endocrinology

## 2016-07-12 ENCOUNTER — Encounter (HOSPITAL_COMMUNITY): Payer: 59

## 2016-07-23 ENCOUNTER — Other Ambulatory Visit: Payer: Self-pay | Admitting: "Endocrinology

## 2016-08-03 ENCOUNTER — Other Ambulatory Visit (HOSPITAL_COMMUNITY): Payer: Self-pay | Admitting: Emergency Medicine

## 2016-08-03 ENCOUNTER — Telehealth (HOSPITAL_COMMUNITY): Payer: Self-pay | Admitting: *Deleted

## 2016-08-03 DIAGNOSIS — C50411 Malignant neoplasm of upper-outer quadrant of right female breast: Secondary | ICD-10-CM

## 2016-08-09 ENCOUNTER — Encounter (HOSPITAL_COMMUNITY): Payer: 59

## 2016-08-09 ENCOUNTER — Ambulatory Visit (HOSPITAL_COMMUNITY)
Admission: RE | Admit: 2016-08-09 | Discharge: 2016-08-09 | Disposition: A | Discharge status: Home / Self Care | Payer: 59 | Source: Ambulatory Visit | Attending: Oncology | Admitting: Oncology

## 2016-08-09 ENCOUNTER — Encounter (HOSPITAL_COMMUNITY): Payer: Self-pay

## 2016-08-09 DIAGNOSIS — Z9889 Other specified postprocedural states: Secondary | ICD-10-CM | POA: Diagnosis not present

## 2016-08-09 DIAGNOSIS — R928 Other abnormal and inconclusive findings on diagnostic imaging of breast: Secondary | ICD-10-CM | POA: Diagnosis not present

## 2016-08-11 ENCOUNTER — Ambulatory Visit (HOSPITAL_COMMUNITY): Payer: 59 | Admitting: Oncology

## 2016-08-11 ENCOUNTER — Encounter (HOSPITAL_COMMUNITY): Payer: Self-pay | Admitting: Adult Health

## 2016-08-11 ENCOUNTER — Encounter (HOSPITAL_COMMUNITY): Payer: 59

## 2016-08-11 ENCOUNTER — Encounter (HOSPITAL_COMMUNITY): Payer: 59 | Attending: Oncology | Admitting: Adult Health

## 2016-08-11 VITALS — BP 126/74 | HR 104 | Resp 16 | Ht 65.0 in | Wt 197.0 lb

## 2016-08-11 DIAGNOSIS — C50411 Malignant neoplasm of upper-outer quadrant of right female breast: Secondary | ICD-10-CM

## 2016-08-11 DIAGNOSIS — F17210 Nicotine dependence, cigarettes, uncomplicated: Secondary | ICD-10-CM | POA: Diagnosis not present

## 2016-08-11 DIAGNOSIS — Z170 Estrogen receptor positive status [ER+]: Secondary | ICD-10-CM | POA: Insufficient documentation

## 2016-08-11 DIAGNOSIS — Z9049 Acquired absence of other specified parts of digestive tract: Secondary | ICD-10-CM | POA: Insufficient documentation

## 2016-08-11 DIAGNOSIS — R5383 Other fatigue: Secondary | ICD-10-CM

## 2016-08-11 DIAGNOSIS — K219 Gastro-esophageal reflux disease without esophagitis: Secondary | ICD-10-CM | POA: Diagnosis not present

## 2016-08-11 DIAGNOSIS — Z79899 Other long term (current) drug therapy: Secondary | ICD-10-CM | POA: Diagnosis not present

## 2016-08-11 LAB — COMPREHENSIVE METABOLIC PANEL
ALT: 19 U/L (ref 14–54)
ANION GAP: 11 (ref 5–15)
AST: 23 U/L (ref 15–41)
Albumin: 3.7 g/dL (ref 3.5–5.0)
Alkaline Phosphatase: 93 U/L (ref 38–126)
BILIRUBIN TOTAL: 0.5 mg/dL (ref 0.3–1.2)
BUN: 13 mg/dL (ref 6–20)
CO2: 27 mmol/L (ref 22–32)
Calcium: 9.1 mg/dL (ref 8.9–10.3)
Chloride: 101 mmol/L (ref 101–111)
Creatinine, Ser: 0.87 mg/dL (ref 0.44–1.00)
GFR calc non Af Amer: >60 mL/min (ref >60)
Glucose, Bld: 122 mg/dL — ABNORMAL HIGH (ref 65–99)
POTASSIUM: 4.6 mmol/L (ref 3.5–5.1)
Sodium: 139 mmol/L (ref 135–145)
TOTAL PROTEIN: 7.2 g/dL (ref 6.5–8.1)

## 2016-08-11 LAB — CBC WITH DIFFERENTIAL/PLATELET
Basophils Absolute: 0 10*3/uL (ref 0.0–0.1)
Basophils Relative: 1 %
Eosinophils Absolute: 0.1 10*3/uL (ref 0.0–0.7)
Eosinophils Relative: 1 %
HEMATOCRIT: 44.2 % (ref 36.0–46.0)
HEMOGLOBIN: 14.6 g/dL (ref 12.0–15.0)
LYMPHS ABS: 2.4 10*3/uL (ref 0.7–4.0)
Lymphocytes Relative: 29 %
MCH: 31.3 pg (ref 26.0–34.0)
MCHC: 33 g/dL (ref 30.0–36.0)
MCV: 94.8 fL (ref 78.0–100.0)
MONO ABS: 0.5 10*3/uL (ref 0.1–1.0)
MONOS PCT: 6 %
Neutro Abs: 5.3 10*3/uL (ref 1.7–7.7)
Neutrophils Relative %: 63 %
Platelets: 194 10*3/uL (ref 150–400)
RBC: 4.66 MIL/uL (ref 3.87–5.11)
RDW: 13.7 % (ref 11.5–15.5)
WBC: 8.3 10*3/uL (ref 4.0–10.5)

## 2016-08-11 NOTE — Patient Instructions (Addendum)
[place] at [facility] Discharge Instructions  RECOMMENDATIONS MADE BY THE CONSULTANT AND ANY TEST RESULTS WILL BE SENT TO YOUR REFERRING PHYSICIAN.  You were seen today by Mike Craze NP. Please call us with your decision about your tamoxifen. Return in 6 months for labs and follow up.    Thank you for choosing [place] at [facility] to provide your oncology and hematology care.  To afford each patient quality time with our provider, please arrive at least 15 minutes before your scheduled appointment time.    If you have a lab appointment with the [facility] please come in thru the  Main Entrance and check in at the main information desk  You need to re-schedule your appointment should you arrive 10 or more minutes late.  We strive to give you quality time with our providers, and arriving late affects you and other patients whose appointments are after yours.  Also, if you no show three or more times for appointments you may be dismissed from the clinic at the providers discretion.     Again, thank you for choosing [facility].  Our [place] is that these requests will decrease the amount of time that you wait before being seen by our physicians.       _____________________________________________________________  Should you have questions after your visit to [facility], please contact our office at [phone] between the hours of 8:30 a.m. and 4:30 p.m.  Voicemails left after 4:30 p.m. will not be returned until the following business day.  For prescription refill requests, have your pharmacy contact our office.       Resources For Cancer Patients and their Caregivers ? American Cancer Society: Can assist with transportation, wigs, general needs, runs Look Good Feel Better.        847 632 2901 ? Cancer Care: Provides financial assistance, online support groups, medication/co-pay assistance.   1-800-813-Bibi (778)621-7999) ? [place] Assists [place] cancer patients and their families through emotional , educational and financial support.  (606) 441-9165 ? [place] DSS Where to apply for food stamps, Medicaid and utility assistance. 864 455 5334 ? [employer]: Transportation to medical appointments. 602-409-3442 ? Social Security Administration: May apply for disability if have a Stage IV cancer. 903-550-9199 (215)777-2747 ? [employer], Disability and Transit Services: Assists with nutrition, care and transit needs. [place] Support Programs: @10RELATIVEDAYS @ > Cancer Support Group  2nd Tuesday of the month 1pm-2pm, Journey Room  > Creative Journey  3rd Tuesday of the month 1130am-1pm, Journey Room  > Look Good Feel Better  1st Wednesday of the month 10am-12 noon, Journey Room (Call [place] to register [phone])

## 2016-08-12 LAB — FOLLICLE STIMULATING HORMONE: FSH: 19.1 m[IU]/mL

## 2016-08-12 LAB — ESTRADIOL: Estradiol: 38.8 pg/mL

## 2016-08-12 NOTE — Progress Notes (Signed)
[address], [place] [address]   CLINIC:  Medical Oncology/Hematology  PCP:  [name], MD [place] [address] [phone]   REASON FOR VISIT:  Follow-up for Stage II invasive ductal carcinoma of (R) breast; ER+/PR+/HER2-  CURRENT THERAPY: Tamoxifen 20 mg daily, since 10/2011   BRIEF ONCOLOGIC HISTORY:    Cancer of upper-outer quadrant of female breast, right   01/06/2011 Initial Diagnosis    Cancer of upper-outer quadrant of female breast, right      01/17/2011 Initial Biopsy    Right breast and axilla biopsy demonstrating invasive ductal carcinoma with metastatic disease in 1/1 lymph nodes.  ER 90%, PR 80%, Ki-67 71%, and Her2 negative by FISH.      02/11/2011 Definitive Surgery    Right breast lumpectomy with right regional lymph node dissection- invasive ductal carcinoma, grade II, 2.5 cm.  No LVI.  CLear margins. 1/16 lymph nodes positive for metastatic ductal carcinoma.        03/01/2011 - 04/12/2011 Chemotherapy    AC x 4 cycles      04/26/2011 - 06/21/2011 Chemotherapy    Taxotere x 4 cycles      07/15/2011 - 08/26/2011 Radiation Therapy    Dr. Pablo Ledger at [facility]      10/2011 -  Anti-estrogen oral therapy    Tamoxifen 20 mg daily.         INTERVAL HISTORY:  Ms. Kirsch 51 y.o. female returns for follow-up for right breast cancer.   She remains on Tamoxifen. She continues to struggle with several symptoms that she attributes to the tamoxifen including hot flashes, weight gain, lower extremity edema, mood swings, and fatigue.  "I just can't remember what I used to feel like before I started taking Tamoxifen."  At her last visit, there was discussion regarding genomic Breast Cancer Index testing, which she was initially interested in having done, but her insurance did not cover. She declined the payment program through [facility] and decided not to have the testing done.  She states, "I want to do the  best thing to help keep my cancer from coming back, but I know that if it's going to come back then it will come back, but I'd rather not feel so bad all the time from the tamoxifen."   She is not sure if she is going through menopause and that is causing some of her symptoms; she has not had a menstrual cycle since 2013.  However, she tells me "my hormones on my blood work don't show that I'm in menopause."   Her fatigue is improving since starting vitamin D supplementation.  She continues to have chronic peripheral neuropathy to her feet and fingers since chemotherapy; she had a bad experience with gabapentin in the past and did not like the way that medication made her feel.    She recent had mammogram on 08/09/16 and it was normal. She denies any specific breast complaints today.     REVIEW OF SYSTEMS:  Review of Systems  Constitutional: Positive for fatigue and unexpected weight change. Negative for appetite change, chills and fever.  HENT:  Negative.   Eyes: Negative.   Respiratory: Negative.   Cardiovascular: Positive for leg swelling.  Gastrointestinal: Negative.  Negative for constipation, diarrhea, nausea and vomiting.  Endocrine: Positive for hot flashes.  Genitourinary: Negative.  Negative for dysuria, hematuria and vaginal bleeding.   Skin: Negative.  Negative for rash.  Neurological: Positive  for numbness. Negative for dizziness and headaches.  Hematological: Negative.   Psychiatric/Behavioral: Positive for depression. The patient is nervous/anxious.      PAST MEDICAL/SURGICAL HISTORY:  Past Medical History:  Diagnosis Date  . Bulging lumbar disc   . Cancer of upper-outer quadrant of female breast (HCC) 01/06/2011   right breast  . GERD (gastroesophageal reflux disease)    Past Surgical History:  Procedure Laterality Date  . BREAST LUMPECTOMY  02/11/2011   right breast and axillary dissection  . BREAST REDUCTION SURGERY  1997  . CHOLECYSTECTOMY  2003  . COLONOSCOPY      . pilonidal cyst     1980-81-83  . PORT-A-CATH REMOVAL  11/10/2011   Procedure: MINOR REMOVAL PORT-A-CATH;  Surgeon: [name], MD;  Location: [facility];  Service: General;  Laterality: Left;  . PORTACATH PLACEMENT  02/11/2011   Procedure: INSERTION PORT-A-CATH;  Surgeon: [name], MD;  Location: [facility] OR;  Service: General;  Laterality: Left;  INSERTION PORT-A-CATH LEFT SUBCLAVIAN  . TUBAL LIGATION  2003  . UTERINE FIBROID SURGERY  09/30/2011   removal 8 polyps - Dr Francee Piccolo     SOCIAL HISTORY:  Social History   Social History  . Marital status: Married    Spouse name: N/A  . Number of children: N/A  . Years of education: N/A   Occupational History  . Not on file.   Social History Main Topics  . Smoking status: Current Every Day Smoker    Packs/day: 0.50    Years: 30.00    Types: Cigarettes  . Smokeless tobacco: Never Used  . Alcohol use No  . Drug use: Yes    Types: Hydrocodone  . Sexual activity: Yes   Other Topics Concern  . Not on file   Social History Narrative  . No narrative on file    FAMILY HISTORY:  Family History  Problem Relation Age of Onset  . Cancer Maternal Aunt        breast  . Cancer Mother        vaginal  . Diabetes Father   . Cancer Sister        histocytosis    CURRENT MEDICATIONS:  Outpatient Encounter Prescriptions as of 08/11/2016  Medication Sig Note  . ALPRAZolam Prudy Feeler) 1 MG tablet  11/30/2015: Received from: External Pharmacy  . levothyroxine (SYNTHROID, LEVOTHROID) 200 MCG tablet TAKE 1 TABLET (200 MCG TOTAL) BY MOUTH DAILY.   . meclizine (ANTIVERT) 25 MG tablet  11/30/2015: Received from: External Pharmacy  . tamoxifen (NOLVADEX) 20 MG tablet TAKE 1 TABLET BY MOUTH DAILY   . [DISCONTINUED] Vitamin D, Ergocalciferol, (DRISDOL) 50000 units CAPS capsule TAKE 1 CAPSULE (50,000 UNITS TOTAL) BY MOUTH EVERY 7 (SEVEN) DAYS.    No facility-administered encounter medications on file as of 08/11/2016.      ALLERGIES:  No Known Allergies   PHYSICAL EXAM:  ECOG Performance status: 1 - Symptomatic; remains independent   Vitals:   08/11/16 1440  BP: 126/74  Pulse: (!) 104  Resp: 16  SpO2: 97%   Filed Weights   08/11/16 1440  Weight: 197 lb (89.4 kg)    Physical Exam  Constitutional: She is oriented to person, place, and time and well-developed, well-nourished, and in no distress.  HENT:  Head: Normocephalic.  Mouth/Throat: Oropharynx is clear and moist. No oropharyngeal exudate.  Eyes: Pupils are equal, round, and reactive to light. Conjunctivae are normal. No scleral icterus.  Neck: Normal range of motion. Neck supple.  Cardiovascular: Regular rhythm.   Tachycardic  Pulmonary/Chest: Effort normal and breath sounds normal. No respiratory distress. She has no wheezes.    Abdominal: Soft. Bowel sounds are normal. There is no tenderness.  Musculoskeletal: Normal range of motion. She exhibits edema (1+ BLE/ankle/pedal edema (taking diuretics)).  Mild (R) arm lymphedema noted   Lymphadenopathy:    She has no cervical adenopathy.       Right: No supraclavicular adenopathy present.       Left: No supraclavicular adenopathy present.  Neurological: She is alert and oriented to person, place, and time. No cranial nerve deficit. Gait normal.  Skin: Skin is warm and dry. No rash noted.  Psychiatric: Mood, memory, affect and judgment normal.  Nursing note and vitals reviewed.    LABORATORY DATA:  I have reviewed the labs as listed.  CBC    Component Value Date/Time   WBC 8.3 08/11/2016 1402   RBC 4.66 08/11/2016 1402   HGB 14.6 08/11/2016 1402   HCT 44.2 08/11/2016 1402   PLT 194 08/11/2016 1402   MCV 94.8 08/11/2016 1402   MCH 31.3 08/11/2016 1402   MCHC 33.0 08/11/2016 1402   RDW 13.7 08/11/2016 1402   LYMPHSABS 2.4 08/11/2016 1402   MONOABS 0.5 08/11/2016 1402   EOSABS 0.1 08/11/2016 1402   BASOSABS 0.0 08/11/2016 1402   CMP Latest Ref Rng & Units 08/11/2016  08/27/2015 04/08/2014  Glucose 65 - 99 mg/dL 122(H) 113(H) 86  BUN 6 - 20 mg/dL '13 11 15  '$ Creatinine 0.44 - 1.00 mg/dL 0.87 0.66 0.76  Sodium 135 - 145 mmol/L 139 136 141  Potassium 3.5 - 5.1 mmol/L 4.6 4.3 4.0  Chloride 101 - 111 mmol/L 101 104 104  CO2 22 - 32 mmol/L '27 26 28  '$ Calcium 8.9 - 10.3 mg/dL 9.1 8.5(L) 9.0  Total Protein 6.5 - 8.1 g/dL 7.2 7.2 7.3  Total Bilirubin 0.3 - 1.2 mg/dL 0.5 0.1(L) 0.3  Alkaline Phos 38 - 126 U/L 93 79 77  AST 15 - 41 U/L '23 24 27  '$ ALT 14 - 54 U/L '19 20 23    '$ PENDING LABS:    DIAGNOSTIC IMAGING:  *The following radiologic images and reports have been reviewed independently and agree with below findings.  Most recent mammogram: 08/09/16 CLINICAL DATA:  Post right breast lumpectomy.  EXAM: 2D DIGITAL DIAGNOSTIC BILATERAL MAMMOGRAM WITH CAD AND ADJUNCT TOMO  COMPARISON:  Previous exam(s).  ACR Breast Density Category b: There are scattered areas of fibroglandular density.  FINDINGS: Mammographically, there are no suspicious masses, areas of nonsurgical architectural distortion or microcalcifications in either breast. Stable postsurgical changes in the right breast upper outer quadrant. Stable changes of reduction mammoplasty, and core needle biopsy of the right breast.  Mammographic images were processed with CAD.  IMPRESSION: No mammographic evidence of malignancy, status post right lumpectomy.  RECOMMENDATION: Diagnostic mammogram is suggested in 1 year. (Code:[id number]-B-01Y)  I have discussed the findings and recommendations with the patient. Results were also provided in writing at the conclusion of the visit. If applicable, a reminder letter will be sent to the patient regarding the next appointment.  BI-RADS CATEGORY  2: Benign.   Electronically Signed   By: Fidela Salisbury M.D.   On: 08/09/2016 14:26    PATHOLOGY:  (R) breast lumpectomy surgical path: 02/11/11        ASSESSMENT & PLAN:   Stage II  invasive ductal carcinoma of right breast, ER+/PR+/HER2-:  -Diagnosed in 2013. Treated with (R) lumpectomy with axillary  lymph node dissection. Went on to complete adjuvant chemotherapy with Adriamycin/Cytoxan x 4 cycles, followed by Taxotere x 4 cycles. Then, went on to have adjuvant radiation therapy.  Started anti-estrogen therapy with Tamoxifen in 10/2011.   -Remains on Tamoxifen at this time. We had very lengthy discussion regarding the purpose of anti-estrogen therapy. There is some recent data suggesting that extending anti-estrogen therapy beyond 5 years may be beneficial in some women. Reviewed again [employer] testing which would help [place] better understand if she may benefit from extended anti-estrogen use; she continues to decline d/t cost concerns, which is understandable.   -We discussed stopping Tamoxifen altogether today given her persistent symptoms she feels may be r/t anti-estrogen therapy.  She remains undecided if she wants to forgo all anti-estrogen therapy (namely, aromatase inhibitor therapy once it is determined that she is menopausal).  I explained the differences between tamoxifen and AI therapy, including their mechanism of action and potential side effects. Educated her that AI therapy carries similar side effect profile of Tamoxifen in terms of hot flashes, etc.  She understands that stopping anti-estrogen therapy after 5 years may be safe in terms of risk of recurrence, but it is difficult to say for sure without [employer] testing.  -After lengthy discussion, we came to an agreement to take a "drug holiday" from the Tamoxifen for the next 6 weeks or so.  If her symptoms improve, then she may decide not to resume Tamoxifen.   If her symptoms do not improve, then we will know that Tamoxifen is not the likely cause of her symptoms and she may considering restarting anti-estrogen therapy.  She will call us to let us know what she decides after this "drug holiday."   -If she continues anti-estrogen  therapy, then we will need to continue to monitor FSH/LH and estradiol levels to determine when she becomes post-menopausal. She has not had menstrual period in several years.  Hormone lab results are pending for today, but I suspect they will show she is pre-menopausal.  Patient agrees with this plan. -Clinical breast exam performed today and benign.  -Most recent mammogram from 08/09/16 reviewed and negative for malignancy; next mammogram will be due in 08/2017; orders to be placed at subsequent follow-up visits.  -Return to cancer center in 6 months with labs.  If she decides to permanently discontinue tamoxifen, then we can transition her follow-up appts to annually.    Bone health:  -No need for DEXA scan at this time. If she decides to remain on anti-estrogen therapy and becomes menopausal, then we will need to obtain baseline DEXA imaging before starting aromatase inhibitor therapy.   -Continue calcium/vitamin D supplementation.      Dispo:  -"Drug holiday" from tamoxifen for ~6 weeks. She will contact us with her decision to resume or permanently stop anti-estrogen therapy.  -Return to cancer center in 6 months for follow-up with labs.    All questions were answered to patient's stated satisfaction. Encouraged patient to call with any new concerns or questions before her next visit to the cancer center and we can certain see her sooner, if needed.    Plan of care discussed with Dr. Talbert Cage, who agrees with the above aforementioned.    A total of 35 minutes was spent in face-to-face care of this patient, with greater than 50% of that time spent in counseling and care-coordination.    Orders placed this encounter:  No orders of the defined types were placed in this encounter.  Mike Craze, NP [place] [phone]

## 2016-08-16 ENCOUNTER — Other Ambulatory Visit (HOSPITAL_COMMUNITY): Payer: Self-pay | Admitting: Oncology

## 2016-10-18 DIAGNOSIS — E039 Hypothyroidism, unspecified: Secondary | ICD-10-CM | POA: Diagnosis not present

## 2016-10-18 DIAGNOSIS — E0590 Thyrotoxicosis, unspecified without thyrotoxic crisis or storm: Secondary | ICD-10-CM | POA: Diagnosis not present

## 2016-10-18 DIAGNOSIS — R739 Hyperglycemia, unspecified: Secondary | ICD-10-CM | POA: Diagnosis not present

## 2016-10-18 DIAGNOSIS — C50011 Malignant neoplasm of nipple and areola, right female breast: Secondary | ICD-10-CM | POA: Diagnosis not present

## 2016-10-18 DIAGNOSIS — Z720 Tobacco use: Secondary | ICD-10-CM | POA: Diagnosis not present

## 2016-10-20 DIAGNOSIS — E559 Vitamin D deficiency, unspecified: Secondary | ICD-10-CM | POA: Diagnosis not present

## 2016-10-20 DIAGNOSIS — C50011 Malignant neoplasm of nipple and areola, right female breast: Secondary | ICD-10-CM | POA: Diagnosis not present

## 2016-10-20 DIAGNOSIS — E039 Hypothyroidism, unspecified: Secondary | ICD-10-CM | POA: Diagnosis not present

## 2016-11-03 ENCOUNTER — Ambulatory Visit: Payer: 59 | Admitting: "Endocrinology

## 2016-11-16 ENCOUNTER — Ambulatory Visit: Payer: 59 | Admitting: "Endocrinology

## 2016-11-16 DIAGNOSIS — J40 Bronchitis, not specified as acute or chronic: Secondary | ICD-10-CM | POA: Diagnosis not present

## 2016-11-16 DIAGNOSIS — R05 Cough: Secondary | ICD-10-CM | POA: Diagnosis not present

## 2016-12-02 DIAGNOSIS — Z01419 Encounter for gynecological examination (general) (routine) without abnormal findings: Secondary | ICD-10-CM | POA: Diagnosis not present

## 2016-12-20 DIAGNOSIS — N840 Polyp of corpus uteri: Secondary | ICD-10-CM | POA: Diagnosis not present

## 2016-12-20 DIAGNOSIS — N950 Postmenopausal bleeding: Secondary | ICD-10-CM | POA: Diagnosis not present

## 2017-01-11 DIAGNOSIS — Z853 Personal history of malignant neoplasm of breast: Secondary | ICD-10-CM | POA: Diagnosis not present

## 2017-01-11 DIAGNOSIS — N840 Polyp of corpus uteri: Secondary | ICD-10-CM | POA: Diagnosis not present

## 2017-01-11 DIAGNOSIS — N950 Postmenopausal bleeding: Secondary | ICD-10-CM | POA: Diagnosis not present

## 2017-01-13 DIAGNOSIS — N950 Postmenopausal bleeding: Secondary | ICD-10-CM | POA: Diagnosis not present

## 2017-01-13 DIAGNOSIS — N840 Polyp of corpus uteri: Secondary | ICD-10-CM | POA: Diagnosis not present

## 2017-01-13 DIAGNOSIS — Z853 Personal history of malignant neoplasm of breast: Secondary | ICD-10-CM | POA: Diagnosis not present

## 2017-02-09 ENCOUNTER — Other Ambulatory Visit (HOSPITAL_COMMUNITY): Payer: Self-pay | Admitting: *Deleted

## 2017-02-09 DIAGNOSIS — C50411 Malignant neoplasm of upper-outer quadrant of right female breast: Secondary | ICD-10-CM

## 2017-02-10 ENCOUNTER — Inpatient Hospital Stay (HOSPITAL_COMMUNITY): Payer: 59 | Attending: Internal Medicine | Admitting: Oncology

## 2017-02-10 ENCOUNTER — Other Ambulatory Visit (HOSPITAL_COMMUNITY): Payer: 59

## 2017-04-13 DIAGNOSIS — E0590 Thyrotoxicosis, unspecified without thyrotoxic crisis or storm: Secondary | ICD-10-CM | POA: Diagnosis not present

## 2017-04-13 DIAGNOSIS — E039 Hypothyroidism, unspecified: Secondary | ICD-10-CM | POA: Diagnosis not present

## 2017-04-13 DIAGNOSIS — R5383 Other fatigue: Secondary | ICD-10-CM | POA: Diagnosis not present

## 2017-04-13 DIAGNOSIS — K219 Gastro-esophageal reflux disease without esophagitis: Secondary | ICD-10-CM | POA: Diagnosis not present

## 2017-04-13 DIAGNOSIS — R739 Hyperglycemia, unspecified: Secondary | ICD-10-CM | POA: Diagnosis not present

## 2017-04-13 DIAGNOSIS — R600 Localized edema: Secondary | ICD-10-CM | POA: Diagnosis not present

## 2017-04-17 DIAGNOSIS — C50011 Malignant neoplasm of nipple and areola, right female breast: Secondary | ICD-10-CM | POA: Diagnosis not present

## 2017-04-17 DIAGNOSIS — Z1389 Encounter for screening for other disorder: Secondary | ICD-10-CM | POA: Diagnosis not present

## 2017-04-17 DIAGNOSIS — E039 Hypothyroidism, unspecified: Secondary | ICD-10-CM | POA: Diagnosis not present

## 2017-04-17 DIAGNOSIS — R739 Hyperglycemia, unspecified: Secondary | ICD-10-CM | POA: Diagnosis not present

## 2017-05-15 DIAGNOSIS — G629 Polyneuropathy, unspecified: Secondary | ICD-10-CM | POA: Diagnosis not present

## 2017-05-15 DIAGNOSIS — E890 Postprocedural hypothyroidism: Secondary | ICD-10-CM | POA: Diagnosis not present

## 2017-05-15 DIAGNOSIS — R5383 Other fatigue: Secondary | ICD-10-CM | POA: Diagnosis not present

## 2017-05-30 DIAGNOSIS — B370 Candidal stomatitis: Secondary | ICD-10-CM | POA: Diagnosis not present

## 2017-05-30 DIAGNOSIS — B3781 Candidal esophagitis: Secondary | ICD-10-CM | POA: Diagnosis not present

## 2017-05-30 DIAGNOSIS — G629 Polyneuropathy, unspecified: Secondary | ICD-10-CM | POA: Diagnosis not present

## 2017-05-30 DIAGNOSIS — E0500 Thyrotoxicosis with diffuse goiter without thyrotoxic crisis or storm: Secondary | ICD-10-CM | POA: Diagnosis not present

## 2017-05-30 DIAGNOSIS — R5383 Other fatigue: Secondary | ICD-10-CM | POA: Diagnosis not present

## 2017-05-30 DIAGNOSIS — E890 Postprocedural hypothyroidism: Secondary | ICD-10-CM | POA: Diagnosis not present

## 2017-07-14 DIAGNOSIS — E890 Postprocedural hypothyroidism: Secondary | ICD-10-CM | POA: Diagnosis not present

## 2017-07-14 DIAGNOSIS — E0500 Thyrotoxicosis with diffuse goiter without thyrotoxic crisis or storm: Secondary | ICD-10-CM | POA: Diagnosis not present

## 2017-07-14 DIAGNOSIS — L680 Hirsutism: Secondary | ICD-10-CM | POA: Diagnosis not present

## 2017-07-24 DIAGNOSIS — Z5181 Encounter for therapeutic drug level monitoring: Secondary | ICD-10-CM | POA: Diagnosis not present

## 2017-07-24 DIAGNOSIS — L680 Hirsutism: Secondary | ICD-10-CM | POA: Diagnosis not present

## 2017-08-16 ENCOUNTER — Telehealth (HOSPITAL_COMMUNITY): Payer: Self-pay

## 2017-08-16 DIAGNOSIS — C50411 Malignant neoplasm of upper-outer quadrant of right female breast: Secondary | ICD-10-CM

## 2017-08-16 DIAGNOSIS — Z170 Estrogen receptor positive status [ER+]: Secondary | ICD-10-CM

## 2017-08-16 NOTE — Telephone Encounter (Signed)
Patient called stating she needed an order entered so she could have a mammogram. She also needs follow up appt set up. Reviewed with provider, order entered and message sent to scheduling to set up patients mammo, labs and follow up appt.

## 2017-08-21 ENCOUNTER — Other Ambulatory Visit (HOSPITAL_COMMUNITY): Payer: Self-pay | Admitting: Internal Medicine

## 2017-08-21 DIAGNOSIS — Z1231 Encounter for screening mammogram for malignant neoplasm of breast: Secondary | ICD-10-CM

## 2017-08-23 ENCOUNTER — Encounter (HOSPITAL_COMMUNITY): Payer: Self-pay

## 2017-08-23 ENCOUNTER — Inpatient Hospital Stay (HOSPITAL_COMMUNITY): Payer: 59 | Attending: Hematology

## 2017-08-23 ENCOUNTER — Ambulatory Visit (HOSPITAL_COMMUNITY)
Admission: RE | Admit: 2017-08-23 | Discharge: 2017-08-23 | Disposition: A | Discharge status: Home / Self Care | Payer: 59 | Source: Ambulatory Visit | Attending: Internal Medicine | Admitting: Internal Medicine

## 2017-08-23 DIAGNOSIS — C50411 Malignant neoplasm of upper-outer quadrant of right female breast: Secondary | ICD-10-CM | POA: Insufficient documentation

## 2017-08-23 DIAGNOSIS — E039 Hypothyroidism, unspecified: Secondary | ICD-10-CM | POA: Diagnosis not present

## 2017-08-23 DIAGNOSIS — Z9221 Personal history of antineoplastic chemotherapy: Secondary | ICD-10-CM | POA: Insufficient documentation

## 2017-08-23 DIAGNOSIS — Z170 Estrogen receptor positive status [ER+]: Secondary | ICD-10-CM | POA: Diagnosis not present

## 2017-08-23 DIAGNOSIS — M25511 Pain in right shoulder: Secondary | ICD-10-CM | POA: Insufficient documentation

## 2017-08-23 DIAGNOSIS — Z1231 Encounter for screening mammogram for malignant neoplasm of breast: Secondary | ICD-10-CM | POA: Insufficient documentation

## 2017-08-23 DIAGNOSIS — N912 Amenorrhea, unspecified: Secondary | ICD-10-CM | POA: Insufficient documentation

## 2017-08-23 DIAGNOSIS — Z923 Personal history of irradiation: Secondary | ICD-10-CM | POA: Insufficient documentation

## 2017-08-23 DIAGNOSIS — E559 Vitamin D deficiency, unspecified: Secondary | ICD-10-CM | POA: Insufficient documentation

## 2017-08-23 LAB — COMPREHENSIVE METABOLIC PANEL
ALBUMIN: 3.9 g/dL (ref 3.5–5.0)
ALK PHOS: 90 U/L (ref 38–126)
ALT: 18 U/L (ref 0–44)
ANION GAP: 12 (ref 5–15)
AST: 17 U/L (ref 15–41)
BUN: 12 mg/dL (ref 6–20)
CALCIUM: 9 mg/dL (ref 8.9–10.3)
CO2: 21 mmol/L — AB (ref 22–32)
CREATININE: 0.72 mg/dL (ref 0.44–1.00)
Chloride: 106 mmol/L (ref 98–111)
GFR calc Af Amer: >60 mL/min (ref >60)
GFR calc non Af Amer: >60 mL/min (ref >60)
GLUCOSE: 101 mg/dL — AB (ref 70–99)
Potassium: 4.4 mmol/L (ref 3.5–5.1)
SODIUM: 139 mmol/L (ref 135–145)
Total Bilirubin: 0.5 mg/dL (ref 0.3–1.2)
Total Protein: 7.2 g/dL (ref 6.5–8.1)

## 2017-08-23 LAB — CBC WITH DIFFERENTIAL/PLATELET
BASOS PCT: 0 %
Basophils Absolute: 0 10*3/uL (ref 0.0–0.1)
Eosinophils Absolute: 0.2 10*3/uL (ref 0.0–0.7)
Eosinophils Relative: 2 %
HEMATOCRIT: 47.2 % — AB (ref 36.0–46.0)
HEMOGLOBIN: 15.9 g/dL — AB (ref 12.0–15.0)
LYMPHS ABS: 3 10*3/uL (ref 0.7–4.0)
Lymphocytes Relative: 28 %
MCH: 31.9 pg (ref 26.0–34.0)
MCHC: 33.7 g/dL (ref 30.0–36.0)
MCV: 94.8 fL (ref 78.0–100.0)
MONOS PCT: 6 %
Monocytes Absolute: 0.7 10*3/uL (ref 0.1–1.0)
NEUTROS ABS: 6.8 10*3/uL (ref 1.7–7.7)
NEUTROS PCT: 64 %
Platelets: 207 10*3/uL (ref 150–400)
RBC: 4.98 MIL/uL (ref 3.87–5.11)
RDW: 14.1 % (ref 11.5–15.5)
WBC: 10.7 10*3/uL — ABNORMAL HIGH (ref 4.0–10.5)

## 2017-08-24 LAB — FOLLICLE STIMULATING HORMONE: FSH: 28.8 m[IU]/mL

## 2017-08-24 LAB — ESTRADIOL: ESTRADIOL: 152.9 pg/mL

## 2017-08-29 ENCOUNTER — Encounter (HOSPITAL_COMMUNITY): Payer: 59

## 2017-08-29 ENCOUNTER — Other Ambulatory Visit (HOSPITAL_COMMUNITY): Payer: 59

## 2017-09-06 ENCOUNTER — Encounter (HOSPITAL_COMMUNITY): Payer: Self-pay | Admitting: Internal Medicine

## 2017-09-06 ENCOUNTER — Inpatient Hospital Stay (HOSPITAL_COMMUNITY): Payer: 59 | Attending: Internal Medicine | Admitting: Internal Medicine

## 2017-09-06 VITALS — BP 121/59 | HR 102 | Temp 98.9°F | Resp 16 | Wt 203.8 lb

## 2017-09-06 DIAGNOSIS — M25511 Pain in right shoulder: Secondary | ICD-10-CM

## 2017-09-06 DIAGNOSIS — C773 Secondary and unspecified malignant neoplasm of axilla and upper limb lymph nodes: Secondary | ICD-10-CM | POA: Diagnosis not present

## 2017-09-06 DIAGNOSIS — Z170 Estrogen receptor positive status [ER+]: Secondary | ICD-10-CM | POA: Diagnosis not present

## 2017-09-06 DIAGNOSIS — E559 Vitamin D deficiency, unspecified: Secondary | ICD-10-CM

## 2017-09-06 DIAGNOSIS — Z923 Personal history of irradiation: Secondary | ICD-10-CM | POA: Diagnosis not present

## 2017-09-06 DIAGNOSIS — N912 Amenorrhea, unspecified: Secondary | ICD-10-CM

## 2017-09-06 DIAGNOSIS — C50411 Malignant neoplasm of upper-outer quadrant of right female breast: Secondary | ICD-10-CM | POA: Diagnosis present

## 2017-09-06 DIAGNOSIS — E039 Hypothyroidism, unspecified: Secondary | ICD-10-CM

## 2017-09-06 DIAGNOSIS — Z9221 Personal history of antineoplastic chemotherapy: Secondary | ICD-10-CM

## 2017-09-06 NOTE — Progress Notes (Signed)
Diagnosis Malignant neoplasm of upper-outer quadrant of right breast in female, estrogen receptor positive ([place]) - Plan: MM 3D SCREEN BREAST BILATERAL, CBC with Differential/Platelet, Comprehensive metabolic panel, Lactate dehydrogenase, Follicle stimulating hormone, Estradiol, Luteinizing hormone, NM Bone Scan Whole Body  Staging Cancer Staging Cancer of upper-outer quadrant of female breast, right Staging form: Breast, AJCC 7th Edition - Clinical: Stage IIB (T2, N1, cM0) - Signed by Haywood Lasso, MD on 01/13/2011 Prognostic indicators: ER 905, PR 80%, Ki67 = 71%    - Pathologic: Stage IIB (T2, N1, cM0) - Signed by Haywood Lasso, MD on 02/14/2011 Prognostic indicators: ER 905, PR 80%, Ki67 = 71%      Assessment and Plan:  1.  Stage II invasive ductal carcinoma of right breast, ER+/PR+/HER2-.  Pt was diagnosed in 2013. Treated with (R) lumpectomy with axillary lymph node dissection. Went on to complete adjuvant chemotherapy with Adriamycin/Cytoxan x 4 cycles, followed by Taxotere x 4 cycles. Then, went on to have adjuvant radiation therapy.  Started anti-estrogen therapy with Tamoxifen in 10/2011.    Pt was last seen by NP Renato Battles in 08/2016 who discussed drug holiday vs forgoing anti-estrogen therapy.  She has been off Tamoxifen for over a year.  She reports her symptoms improved once she came off therapy.  I have discussed with her options for extended adjuvant therapy for 10 years are now being discussed with pts.    Mammogram done 08/23/2017 reviewed and was negative.  She is recommended for screening mammogram in 08/2018.  She will be seen for follow-up at that time.    2.  Right shoulder discomfort.  She reports some tightness on right shoulder.  She is set up for bone scan for further evaluation.  She will be notified of results.    3.  Amenorrhea.  She has not had a menstrual cycle since 2013.  Ryegate done 08/23/2017 was 29 placing her in menopausal range.  Will repeat hormone  levels on RTC in 08/2018.    4.  Lymphedema.  Mild noted in right arm.  Pt advised to use exercises and wear sleeve.  May refer to lymphedema clinic if symptoms worsen.    5.  Health maintenance.  Continue GI follow-up as recommended.    All questions were answered and she expressed understanding of the information presented.    Greater than 30 minutes spent with more than 50% spent in counseling and coordination of care.     Interval History:  Historical data obtained from note dated 08/11/2016.  Stage II invasive ductal carcinoma of right breast, ER+/PR+/HER2-.  Pt was diagnosed in 2013. Treated with (R) lumpectomy with axillary lymph node dissection. Went on to complete adjuvant chemotherapy with Adriamycin/Cytoxan x 4 cycles, followed by Taxotere x 4 cycles. Then, went on to have adjuvant radiation therapy.  Started anti-estrogen therapy with Tamoxifen in 10/2011.    Pt was last seen by NP Renato Battles in 08/2016 who discussed drug holiday vs forgoing anti-estrogen therapy.  She has been off Tamoxifen for over a year.  Current Status:  Pt is seen today for follow-up.  She reports some right shoulder pain.  When questioned, she has been off Tamoxifen since 08/2016.    PATHOLOGY:  (R) breast lumpectomy surgical path: 02/11/11          Cancer of upper-outer quadrant of female breast, right   01/06/2011 Initial Diagnosis    Cancer of upper-outer quadrant of female breast, right    01/17/2011 Initial Biopsy  Right breast and axilla biopsy demonstrating invasive ductal carcinoma with metastatic disease in 1/1 lymph nodes.  ER 90%, PR 80%, Ki-67 71%, and Her2 negative by FISH.    02/11/2011 Definitive Surgery    Right breast lumpectomy with right regional lymph node dissection- invasive ductal carcinoma, grade II, 2.5 cm.  No LVI.  CLear margins. 1/16 lymph nodes positive for metastatic ductal carcinoma.      03/01/2011 - 04/12/2011 Chemotherapy    AC x 4 cycles    04/26/2011 - 06/21/2011  Chemotherapy    Taxotere x 4 cycles    07/15/2011 - 08/26/2011 Radiation Therapy    Dr. Pablo Ledger at [facility]    10/2011 -  Anti-estrogen oral therapy    Tamoxifen 20 mg daily.      Problem List Patient Active Problem List   Diagnosis Date Noted  . Vitamin D deficiency [E55.9] 05/03/2016  . Hypothyroidism following radioiodine therapy [E89.0] 11/19/2014  . Cancer of upper-outer quadrant of female breast, right [C50.419] 01/06/2011    Past Medical History Past Medical History:  Diagnosis Date  . Bulging lumbar disc   . Cancer of upper-outer quadrant of female breast ([place]) 01/06/2011   right breast  . GERD (gastroesophageal reflux disease)     Past Surgical History Past Surgical History:  Procedure Laterality Date  . BREAST LUMPECTOMY  02/11/2011   right breast and axillary dissection  . BREAST REDUCTION SURGERY  1997  . CHOLECYSTECTOMY  2003  . COLONOSCOPY    . pilonidal cyst     1980-81-83  . PORT-A-CATH REMOVAL  11/10/2011   Procedure: MINOR REMOVAL PORT-A-CATH;  Surgeon: Haywood Lasso, MD;  Location: [facility];  Service: General;  Laterality: Left;  . PORTACATH PLACEMENT  02/11/2011   Procedure: INSERTION PORT-A-CATH;  Surgeon: Haywood Lasso, MD;  Location: [facility];  Service: General;  Laterality: Left;  INSERTION PORT-A-CATH LEFT SUBCLAVIAN  . REDUCTION MAMMAPLASTY Bilateral   . TUBAL LIGATION  2003  . UTERINE FIBROID SURGERY  09/30/2011   removal 8 polyps - Dr Derenda Mis    Family History Family History  Problem Relation Age of Onset  . Cancer Maternal Aunt        breast  . Cancer Mother        vaginal  . Diabetes Father   . Cancer Sister        histocytosis     Social History  reports that she has been smoking cigarettes. She has a 15.00 pack-year smoking history. She has never used smokeless tobacco. She reports that she has current or past drug history. Drug: Hydrocodone. She reports that she does not drink  alcohol.  Medications  Current Outpatient Medications:  .  ALPRAZolam (XANAX) 1 MG tablet, , Disp: , Rfl:  .  levothyroxine (SYNTHROID, LEVOTHROID) 200 MCG tablet, TAKE 1 TABLET (200 MCG TOTAL) BY MOUTH DAILY., Disp: 90 tablet, Rfl: 2 .  meclizine (ANTIVERT) 25 MG tablet, , Disp: , Rfl:   Allergies Patient has no known allergies.  Review of Systems Review of Systems - Oncology ROS negative other than right shoulder discomfort   Physical Exam  Vitals Wt Readings from Last 3 Encounters:  09/06/17 203 lb 12.8 oz (92.4 kg)  08/11/16 197 lb (89.4 kg)  05/03/16 207 lb (93.9 kg)   Temp Readings from Last 3 Encounters:  09/06/17 98.9 F (37.2 C) (Oral)  11/30/15 98.2 F (36.8 C) (Oral)  01/15/15 98.2 F (36.8 C) (Oral)   BP Readings  from Last 3 Encounters:  09/06/17 (!) 121/59  08/11/16 126/74  05/03/16 132/84   Pulse Readings from Last 3 Encounters:  09/06/17 (!) 102  08/11/16 (!) 104  05/03/16 (!) 52   Constitutional: Well-developed, well-nourished, and in no distress.   HENT: Head: Normocephalic and atraumatic.  Mouth/Throat: No oropharyngeal exudate. Mucosa moist. Eyes: Pupils are equal, round, and reactive to light. Conjunctivae are normal. No scleral icterus.  Neck: Normal range of motion. Neck supple. No JVD present.  Cardiovascular: Normal rate, regular rhythm and normal heart sounds.  Exam reveals no gallop and no friction rub.   No murmur heard. Pulmonary/Chest: Effort normal and breath sounds normal. No respiratory distress. No wheezes.No rales.  Abdominal: Soft. Bowel sounds are normal. No distension. There is no tenderness. There is no guarding.  Musculoskeletal: Mild lymphedema right arm.  Decreased ROM on right shoulder due to pain.   Lymphadenopathy: No cervical, axillary or supraclavicular adenopathy.  Neurological: Alert and oriented to person, place, and time. No cranial nerve deficit.  Skin: Skin is warm and dry. No rash noted. No erythema. No  pallor.  Psychiatric: Affect and judgment normal.  Bilateral breast exam:  right lumpectomy.  No palpable masses noted bilaterally.    Labs No visits with results within 3 Day(s) from this visit.  Latest known visit with results is:  Appointment on 08/23/2017  Component Date Value Ref Range Status  . WBC 08/23/2017 10.7* 4.0 - 10.5 K/uL Final  . RBC 08/23/2017 4.98  3.87 - 5.11 MIL/uL Final  . Hemoglobin 08/23/2017 15.9* 12.0 - 15.0 g/dL Final  . HCT 08/23/2017 47.2* 36.0 - 46.0 % Final  . MCV 08/23/2017 94.8  78.0 - 100.0 fL Final  . MCH 08/23/2017 31.9  26.0 - 34.0 pg Final  . MCHC 08/23/2017 33.7  30.0 - 36.0 g/dL Final  . RDW 08/23/2017 14.1  11.5 - 15.5 % Final  . Platelets 08/23/2017 207  150 - 400 K/uL Final  . Neutrophils Relative % 08/23/2017 64  % Final  . Neutro Abs 08/23/2017 6.8  1.7 - 7.7 K/uL Final  . Lymphocytes Relative 08/23/2017 28  % Final  . Lymphs Abs 08/23/2017 3.0  0.7 - 4.0 K/uL Final  . Monocytes Relative 08/23/2017 6  % Final  . Monocytes Absolute 08/23/2017 0.7  0.1 - 1.0 K/uL Final  . Eosinophils Relative 08/23/2017 2  % Final  . Eosinophils Absolute 08/23/2017 0.2  0.0 - 0.7 K/uL Final  . Basophils Relative 08/23/2017 0  % Final  . Basophils Absolute 08/23/2017 0.0  0.0 - 0.1 K/uL Final   Performed at [facility], [address], [place], [place] [address]  . Sodium 08/23/2017 139  135 - 145 mmol/L Final  . Potassium 08/23/2017 4.4  3.5 - 5.1 mmol/L Final  . Chloride 08/23/2017 106  98 - 111 mmol/L Final  . CO2 08/23/2017 21* 22 - 32 mmol/L Final  . Glucose, Bld 08/23/2017 101* 70 - 99 mg/dL Final  . BUN 08/23/2017 12  6 - 20 mg/dL Final  . Creatinine, Ser 08/23/2017 0.72  0.44 - 1.00 mg/dL Final  . Calcium 08/23/2017 9.0  8.9 - 10.3 mg/dL Final  . Total Protein 08/23/2017 7.2  6.5 - 8.1 g/dL Final  . Albumin 08/23/2017 3.9  3.5 - 5.0 g/dL Final  . AST 08/23/2017 17  15 - 41 U/L Final  . ALT 08/23/2017 18  0 - 44 U/L Final  . Alkaline Phosphatase  08/23/2017 90  38 - 126  U/L Final  . Total Bilirubin 08/23/2017 0.5  0.3 - 1.2 mg/dL Final  . GFR calc non Af Amer 08/23/2017 >60  >60 mL/min Final  . GFR calc Af Amer 08/23/2017 >60  >60 mL/min Final   Comment: (NOTE) The eGFR has been calculated using the CKD EPI equation. This calculation has not been validated in all clinical situations. eGFR's persistently <60 mL/min signify possible Chronic Kidney Disease.   Georgiann Hahn gap 08/23/2017 12  5 - 15 Final   Performed at [facility], [address], [place], [place] [address]  . Estradiol 08/23/2017 152.9  pg/mL Final   Comment: (NOTE)                    Adult Female:                      Follicular phase   39.7 -   166.0                      Ovulation phase    85.8 -   498.0                      Luteal phase       43.8 -   211.0                      Postmenopausal     <6.0 -    54.7                    Pregnancy                      1st trimester     215.0 - >4300.0                    Girls (1-10 years)    6.0 -    27.0 Roche ECLIA methodology Performed At: [facility] [place], [place] [id number] Rush Farmer MD [id number]   . [facility] 08/23/2017 28.8  mIU/mL Final   Comment: (NOTE)                    Adult Female:                      Follicular phase      3.5 -  12.5                      Ovulation phase       4.7 -  21.5                      Luteal phase          1.7 -   7.7                      Postmenopausal       25.8 - 134.8 Performed At: [facility] [place], [place] [id number] Rush Farmer MD QQ:[id number]      Pathology Orders Placed This Encounter  Procedures  . MM 3D SCREEN BREAST BILATERAL    Standing Status:   Future    Standing Expiration Date:   11/07/2018    Order Specific Question:   Reason for Exam (SYMPTOM  OR DIAGNOSIS REQUIRED)    Answer:   right breast cancer  Order Specific Question:   Is the patient pregnant?    Answer:   No    Order Specific  Question:   Preferred imaging location?    Answer:   [facility] Bone Scan Whole Body    Standing Status:   Future    Standing Expiration Date:   09/06/2018    Order Specific Question:   If indicated for the ordered procedure, I authorize the administration of a radiopharmaceutical per Radiology protocol    Answer:   Yes    Order Specific Question:   Is the patient pregnant?    Answer:   No    Order Specific Question:   Preferred imaging location?    Answer:   [facility]    Order Specific Question:   Radiology Contrast Protocol - do NOT remove file path    Answer:   [email or web address]  . CBC with Differential/Platelet    Standing Status:   Future    Standing Expiration Date:   09/07/2019  . Comprehensive metabolic panel    Standing Status:   Future    Standing Expiration Date:   09/07/2019  . Lactate dehydrogenase    Standing Status:   Future    Standing Expiration Date:   09/07/2019  . Follicle stimulating hormone    Standing Status:   Future    Standing Expiration Date:   09/07/2019  . Estradiol    Standing Status:   Future    Standing Expiration Date:   09/07/2019  . Luteinizing hormone    Standing Status:   Future    Standing Expiration Date:   09/07/2019       Zoila Shutter MD

## 2017-09-06 NOTE — Patient Instructions (Signed)
[facility] at [facility] Discharge Instructions  You saw Dr. Higgs today.   Thank you for choosing [facility] at [facility] to provide your oncology and hematology care.  To afford each patient quality time with our provider, please arrive at least 15 minutes before your scheduled appointment time.   If you have a lab appointment with the Cancer Center please come in thru the  Main Entrance and check in at the main information desk  You need to re-schedule your appointment should you arrive 10 or more minutes late.  We strive to give you quality time with our providers, and arriving late affects you and other patients whose appointments are after yours.  Also, if you no show three or more times for appointments you may be dismissed from the clinic at the providers discretion.     Again, thank you for choosing [facility].  Our Av[place]nn is that these requests will decrease the amount of time that you wait before being seen by our physicians.       _____________________________________________________________  Should you have questions after your visit to [facility], please contact our office at [phone] between the hours of 8:00 a.m. and 4:30 p.m.  Voicemails left after 4:00 p.m. will not be returned until the following business day.  For prescription refill requests, have your pharmacy contact our office and allow 72 hours.    Cancer Center Support Programs:   > Cancer Support Group  2nd Tuesday of the month 1pm-2pm, Journey Room    

## 2017-09-11 ENCOUNTER — Encounter (HOSPITAL_COMMUNITY)
Admission: RE | Admit: 2017-09-11 | Discharge: 2017-09-11 | Disposition: A | Discharge status: Home / Self Care | Payer: 59 | Source: Ambulatory Visit | Attending: Internal Medicine | Admitting: Internal Medicine

## 2017-09-11 DIAGNOSIS — C50411 Malignant neoplasm of upper-outer quadrant of right female breast: Secondary | ICD-10-CM | POA: Diagnosis present

## 2017-09-11 DIAGNOSIS — Z170 Estrogen receptor positive status [ER+]: Secondary | ICD-10-CM | POA: Diagnosis not present

## 2017-09-11 DIAGNOSIS — Z853 Personal history of malignant neoplasm of breast: Secondary | ICD-10-CM | POA: Diagnosis not present

## 2017-09-11 MED ORDER — TECHNETIUM TC 99M MEDRONATE IV KIT
20.0000 | PACK | Freq: Once | INTRAVENOUS | Status: AC | PRN
  Administered 2017-09-11: 20.3 via INTRAVENOUS

## 2017-11-13 DIAGNOSIS — Z720 Tobacco use: Secondary | ICD-10-CM | POA: Diagnosis not present

## 2017-11-13 DIAGNOSIS — R739 Hyperglycemia, unspecified: Secondary | ICD-10-CM | POA: Diagnosis not present

## 2017-11-13 DIAGNOSIS — E0590 Thyrotoxicosis, unspecified without thyrotoxic crisis or storm: Secondary | ICD-10-CM | POA: Diagnosis not present

## 2017-11-13 DIAGNOSIS — E039 Hypothyroidism, unspecified: Secondary | ICD-10-CM | POA: Diagnosis not present

## 2017-11-13 DIAGNOSIS — I972 Postmastectomy lymphedema syndrome: Secondary | ICD-10-CM | POA: Diagnosis not present

## 2017-11-13 DIAGNOSIS — E559 Vitamin D deficiency, unspecified: Secondary | ICD-10-CM | POA: Diagnosis not present

## 2017-11-15 DIAGNOSIS — R5382 Chronic fatigue, unspecified: Secondary | ICD-10-CM | POA: Diagnosis not present

## 2017-11-15 DIAGNOSIS — G620 Drug-induced polyneuropathy: Secondary | ICD-10-CM | POA: Diagnosis not present

## 2017-11-15 DIAGNOSIS — E039 Hypothyroidism, unspecified: Secondary | ICD-10-CM | POA: Diagnosis not present

## 2017-12-11 DIAGNOSIS — Z01419 Encounter for gynecological examination (general) (routine) without abnormal findings: Secondary | ICD-10-CM | POA: Diagnosis not present

## 2017-12-13 DIAGNOSIS — N939 Abnormal uterine and vaginal bleeding, unspecified: Secondary | ICD-10-CM | POA: Diagnosis not present

## 2017-12-13 DIAGNOSIS — D259 Leiomyoma of uterus, unspecified: Secondary | ICD-10-CM | POA: Diagnosis not present

## 2017-12-13 DIAGNOSIS — N840 Polyp of corpus uteri: Secondary | ICD-10-CM | POA: Diagnosis not present

## 2018-01-04 DIAGNOSIS — Z853 Personal history of malignant neoplasm of breast: Secondary | ICD-10-CM | POA: Diagnosis not present

## 2018-01-04 DIAGNOSIS — N938 Other specified abnormal uterine and vaginal bleeding: Secondary | ICD-10-CM | POA: Diagnosis not present

## 2018-01-04 DIAGNOSIS — N840 Polyp of corpus uteri: Secondary | ICD-10-CM | POA: Diagnosis not present

## 2018-01-05 DIAGNOSIS — N938 Other specified abnormal uterine and vaginal bleeding: Secondary | ICD-10-CM | POA: Diagnosis not present

## 2018-01-05 DIAGNOSIS — N939 Abnormal uterine and vaginal bleeding, unspecified: Secondary | ICD-10-CM | POA: Diagnosis not present

## 2018-01-05 DIAGNOSIS — N840 Polyp of corpus uteri: Secondary | ICD-10-CM | POA: Diagnosis not present

## 2018-01-05 DIAGNOSIS — Z853 Personal history of malignant neoplasm of breast: Secondary | ICD-10-CM | POA: Diagnosis not present

## 2018-01-22 DIAGNOSIS — N939 Abnormal uterine and vaginal bleeding, unspecified: Secondary | ICD-10-CM | POA: Diagnosis not present

## 2018-01-22 DIAGNOSIS — Z09 Encounter for follow-up examination after completed treatment for conditions other than malignant neoplasm: Secondary | ICD-10-CM | POA: Diagnosis not present

## 2018-01-22 DIAGNOSIS — N8502 Endometrial intraepithelial neoplasia [EIN]: Secondary | ICD-10-CM | POA: Diagnosis not present

## 2018-02-14 DIAGNOSIS — N8502 Endometrial intraepithelial neoplasia [EIN]: Secondary | ICD-10-CM | POA: Diagnosis not present

## 2018-02-14 DIAGNOSIS — Z853 Personal history of malignant neoplasm of breast: Secondary | ICD-10-CM | POA: Diagnosis not present

## 2018-02-14 DIAGNOSIS — D251 Intramural leiomyoma of uterus: Secondary | ICD-10-CM | POA: Diagnosis not present

## 2018-02-15 DIAGNOSIS — D251 Intramural leiomyoma of uterus: Secondary | ICD-10-CM | POA: Diagnosis not present

## 2018-02-15 DIAGNOSIS — C50919 Malignant neoplasm of unspecified site of unspecified female breast: Secondary | ICD-10-CM | POA: Diagnosis not present

## 2018-02-15 DIAGNOSIS — Z853 Personal history of malignant neoplasm of breast: Secondary | ICD-10-CM | POA: Diagnosis not present

## 2018-02-15 DIAGNOSIS — N8502 Endometrial intraepithelial neoplasia [EIN]: Secondary | ICD-10-CM | POA: Diagnosis not present

## 2018-02-15 DIAGNOSIS — N939 Abnormal uterine and vaginal bleeding, unspecified: Secondary | ICD-10-CM | POA: Diagnosis not present

## 2018-03-12 DIAGNOSIS — Z79899 Other long term (current) drug therapy: Secondary | ICD-10-CM | POA: Diagnosis not present

## 2018-03-12 DIAGNOSIS — E0500 Thyrotoxicosis with diffuse goiter without thyrotoxic crisis or storm: Secondary | ICD-10-CM | POA: Diagnosis not present

## 2018-03-12 DIAGNOSIS — E890 Postprocedural hypothyroidism: Secondary | ICD-10-CM | POA: Diagnosis not present

## 2018-05-17 DIAGNOSIS — E039 Hypothyroidism, unspecified: Secondary | ICD-10-CM | POA: Diagnosis not present

## 2018-05-17 DIAGNOSIS — R5382 Chronic fatigue, unspecified: Secondary | ICD-10-CM | POA: Diagnosis not present

## 2018-05-17 DIAGNOSIS — G620 Drug-induced polyneuropathy: Secondary | ICD-10-CM | POA: Diagnosis not present

## 2018-09-06 ENCOUNTER — Other Ambulatory Visit (HOSPITAL_COMMUNITY): Payer: Self-pay | Admitting: Surgery

## 2018-09-06 DIAGNOSIS — Z170 Estrogen receptor positive status [ER+]: Secondary | ICD-10-CM

## 2018-09-06 DIAGNOSIS — C50411 Malignant neoplasm of upper-outer quadrant of right female breast: Secondary | ICD-10-CM

## 2018-09-07 ENCOUNTER — Ambulatory Visit (HOSPITAL_COMMUNITY): Payer: Self-pay

## 2018-09-07 ENCOUNTER — Inpatient Hospital Stay (HOSPITAL_COMMUNITY): Payer: Self-pay

## 2018-09-07 ENCOUNTER — Ambulatory Visit (HOSPITAL_COMMUNITY): Payer: 59

## 2018-09-19 ENCOUNTER — Ambulatory Visit (HOSPITAL_COMMUNITY): Payer: 59 | Admitting: Hematology

## 2018-10-18 ENCOUNTER — Other Ambulatory Visit: Payer: Self-pay

## 2018-10-18 ENCOUNTER — Inpatient Hospital Stay (HOSPITAL_COMMUNITY): Payer: 59 | Attending: Hematology

## 2018-10-18 ENCOUNTER — Ambulatory Visit (HOSPITAL_COMMUNITY)
Admission: RE | Admit: 2018-10-18 | Discharge: 2018-10-18 | Disposition: A | Discharge status: Home / Self Care | Payer: 59 | Source: Ambulatory Visit | Attending: Hematology | Admitting: Hematology

## 2018-10-18 DIAGNOSIS — C773 Secondary and unspecified malignant neoplasm of axilla and upper limb lymph nodes: Secondary | ICD-10-CM | POA: Diagnosis not present

## 2018-10-18 DIAGNOSIS — Z1231 Encounter for screening mammogram for malignant neoplasm of breast: Secondary | ICD-10-CM | POA: Diagnosis not present

## 2018-10-18 DIAGNOSIS — Z79810 Long term (current) use of selective estrogen receptor modulators (SERMs): Secondary | ICD-10-CM | POA: Insufficient documentation

## 2018-10-18 DIAGNOSIS — Z170 Estrogen receptor positive status [ER+]: Secondary | ICD-10-CM | POA: Diagnosis not present

## 2018-10-18 DIAGNOSIS — C50411 Malignant neoplasm of upper-outer quadrant of right female breast: Secondary | ICD-10-CM | POA: Insufficient documentation

## 2018-10-18 LAB — CBC WITH DIFFERENTIAL/PLATELET
Abs Immature Granulocytes: 0.03 10*3/uL (ref 0.00–0.07)
Basophils Absolute: 0.1 10*3/uL (ref 0.0–0.1)
Basophils Relative: 1 %
Eosinophils Absolute: 0.2 10*3/uL (ref 0.0–0.5)
Eosinophils Relative: 2 %
HCT: 46.7 % — ABNORMAL HIGH (ref 36.0–46.0)
Hemoglobin: 15.1 g/dL — ABNORMAL HIGH (ref 12.0–15.0)
Immature Granulocytes: 0 %
Lymphocytes Relative: 28 %
Lymphs Abs: 2.3 10*3/uL (ref 0.7–4.0)
MCH: 30.4 pg (ref 26.0–34.0)
MCHC: 32.3 g/dL (ref 30.0–36.0)
MCV: 94 fL (ref 80.0–100.0)
Monocytes Absolute: 0.5 10*3/uL (ref 0.1–1.0)
Monocytes Relative: 6 %
Neutro Abs: 5.1 10*3/uL (ref 1.7–7.7)
Neutrophils Relative %: 63 %
Platelets: 255 10*3/uL (ref 150–400)
RBC: 4.97 MIL/uL (ref 3.87–5.11)
RDW: 13.8 % (ref 11.5–15.5)
WBC: 8.1 10*3/uL (ref 4.0–10.5)
nRBC: 0 % (ref 0.0–0.2)

## 2018-10-18 LAB — COMPREHENSIVE METABOLIC PANEL
ALT: 16 U/L (ref 0–44)
AST: 18 U/L (ref 15–41)
Albumin: 3.7 g/dL (ref 3.5–5.0)
Alkaline Phosphatase: 98 U/L (ref 38–126)
Anion gap: 11 (ref 5–15)
BUN: 14 mg/dL (ref 6–20)
CO2: 24 mmol/L (ref 22–32)
Calcium: 9 mg/dL (ref 8.9–10.3)
Chloride: 105 mmol/L (ref 98–111)
Creatinine, Ser: 0.72 mg/dL (ref 0.44–1.00)
GFR calc Af Amer: >60 mL/min (ref >60)
GFR calc non Af Amer: >60 mL/min (ref >60)
Glucose, Bld: 142 mg/dL — ABNORMAL HIGH (ref 70–99)
Potassium: 4.2 mmol/L (ref 3.5–5.1)
Sodium: 140 mmol/L (ref 135–145)
Total Bilirubin: 0.1 mg/dL — ABNORMAL LOW (ref 0.3–1.2)
Total Protein: 7.3 g/dL (ref 6.5–8.1)

## 2018-10-18 LAB — LACTATE DEHYDROGENASE: LDH: 111 U/L (ref 98–192)

## 2018-10-19 LAB — LUTEINIZING HORMONE: LH: 27.4 m[IU]/mL

## 2018-10-19 LAB — ESTRADIOL: Estradiol: 33.1 pg/mL

## 2018-10-19 LAB — FOLLICLE STIMULATING HORMONE: FSH: 43.4 m[IU]/mL

## 2018-10-30 ENCOUNTER — Inpatient Hospital Stay (HOSPITAL_BASED_OUTPATIENT_CLINIC_OR_DEPARTMENT_OTHER): Payer: 59 | Admitting: Hematology

## 2018-10-30 ENCOUNTER — Other Ambulatory Visit: Payer: Self-pay

## 2018-10-30 VITALS — BP 140/77 | HR 97 | Temp 97.5°F | Resp 18 | Wt 208.5 lb

## 2018-10-30 DIAGNOSIS — Z170 Estrogen receptor positive status [ER+]: Secondary | ICD-10-CM | POA: Diagnosis not present

## 2018-10-30 DIAGNOSIS — C50411 Malignant neoplasm of upper-outer quadrant of right female breast: Secondary | ICD-10-CM | POA: Diagnosis not present

## 2018-10-30 DIAGNOSIS — Z79810 Long term (current) use of selective estrogen receptor modulators (SERMs): Secondary | ICD-10-CM | POA: Diagnosis not present

## 2018-10-30 DIAGNOSIS — C773 Secondary and unspecified malignant neoplasm of axilla and upper limb lymph nodes: Secondary | ICD-10-CM | POA: Diagnosis not present

## 2018-10-30 NOTE — Patient Instructions (Signed)
[facility] at [facility] Discharge Instructions  You were seen today by Dr. Katragadda. He went over your recent lab results. He will see you back in 1 year for labs and follow up.   Thank you for choosing [facility] at [facility] to provide your oncology and hematology care.  To afford each patient quality time with our provider, please arrive at least 15 minutes before your scheduled appointment time.   If you have a lab appointment with the Cancer Center please come in thru the  Main Entrance and check in at the main information desk  You need to re-schedule your appointment should you arrive 10 or more minutes late.  We strive to give you quality time with our providers, and arriving late affects you and other patients whose appointments are after yours.  Also, if you no show three or more times for appointments you may be dismissed from the clinic at the providers discretion.     Again, thank you for choosing [facility].  Our [place] is that these requests will decrease the amount of time that you wait before being seen by our physicians.       _____________________________________________________________  Should you have questions after your visit to [facility], please contact our office at [phone] between the hours of 8:00 a.m. and 4:30 p.m.  Voicemails left after 4:00 p.m. will not be returned until the following business day.  For prescription refill requests, have your pharmacy contact our office and allow 72 hours.    Cancer Center Support Programs:   > Cancer Support Group  2nd Tuesday of the month 1pm-2pm, Journey Room    

## 2018-10-30 NOTE — Progress Notes (Signed)
Sheila Jefferson, [place] [address]   CLINIC:  Medical Oncology/Hematology  PCP:  [name], MD [place] [address] [phone]   REASON FOR VISIT:  Follow-up for right breast cancer.  CURRENT THERAPY: Observation.  BRIEF ONCOLOGIC HISTORY:  Oncology History  Cancer of upper-outer quadrant of female breast, right  01/06/2011 Initial Diagnosis   Cancer of upper-outer quadrant of female breast, right   01/17/2011 Initial Biopsy   Right breast and axilla biopsy demonstrating invasive ductal carcinoma with metastatic disease in 1/1 lymph nodes.  ER 90%, PR 80%, Ki-67 71%, and Her2 negative by FISH.   02/11/2011 Definitive Surgery   Right breast lumpectomy with right regional lymph node dissection- invasive ductal carcinoma, grade II, 2.5 cm.  No LVI.  CLear margins. 1/16 lymph nodes positive for metastatic ductal carcinoma.     03/01/2011 - 04/12/2011 Chemotherapy   AC x 4 cycles   04/26/2011 - 06/21/2011 Chemotherapy   Taxotere x 4 cycles   07/15/2011 - 08/26/2011 Radiation Therapy   Dr. Pablo Ledger at [facility]   10/2011 -  Anti-estrogen oral therapy   Tamoxifen 20 mg daily.      CANCER STAGING: Cancer Staging Cancer of upper-outer quadrant of female breast, right Staging form: Breast, AJCC 7th Edition - Clinical: Stage IIB (T2, N1, cM0) - Signed by Haywood Lasso, MD on 01/13/2011 - Pathologic: Stage IIB (T2, N1, cM0) - Signed by Haywood Lasso, MD on 02/14/2011    INTERVAL HISTORY:  Sheila Jefferson 53 y.o. female seen for follow-up of right breast cancer.  She finished taking tamoxifen for 5 years.  She had to stop it at 5 years secondary to severe hot flashes.  Appetite is 100%.  Energy levels are 25%.  She reports some discomfort in the right shoulder region when she raises it above her head level.  Mild fatigue is stable.  Numbness in the hands and feet is stable from prior chemotherapy.  No new onset  pains reported.    REVIEW OF SYSTEMS:  Review of Systems  Constitutional: Positive for fatigue.  Neurological: Positive for numbness.  All other systems reviewed and are negative.    PAST MEDICAL/SURGICAL HISTORY:  Past Medical History:  Diagnosis Date  . Bulging lumbar disc   . Cancer of upper-outer quadrant of female breast ([facility]) 01/06/2011   right breast  . GERD (gastroesophageal reflux disease)    Past Surgical History:  Procedure Laterality Date  . BREAST LUMPECTOMY  02/11/2011   right breast and axillary dissection  . BREAST REDUCTION SURGERY  1997  . CHOLECYSTECTOMY  2003  . COLONOSCOPY    . pilonidal cyst     1980-81-83  . PORT-A-CATH REMOVAL  11/10/2011   Procedure: MINOR REMOVAL PORT-A-CATH;  Surgeon: Haywood Lasso, MD;  Location: [facility];  Service: General;  Laterality: Left;  . PORTACATH PLACEMENT  02/11/2011   Procedure: INSERTION PORT-A-CATH;  Surgeon: Haywood Lasso, MD;  Location: [facility];  Service: General;  Laterality: Left;  INSERTION PORT-A-CATH LEFT SUBCLAVIAN  . REDUCTION MAMMAPLASTY Bilateral   . TUBAL LIGATION  2003  . UTERINE FIBROID SURGERY  09/30/2011   removal 8 polyps - Dr Derenda Mis     SOCIAL HISTORY:  Social History   Socioeconomic History  . Marital status: Married    Spouse name: Not on file  . Number of children: Not on file  . Years of education: Not on file  .  Highest education level: Not on file  Occupational History  . Not on file  Social Needs  . Financial resource strain: Not on file  . Food insecurity    Worry: Not on file    Inability: Not on file  . Transportation needs    Medical: Not on file    Non-medical: Not on file  Tobacco Use  . Smoking status: Current Every Day Smoker    Packs/day: 0.50    Years: 30.00    Pack years: 15.00    Types: Cigarettes  . Smokeless tobacco: Never Used  Substance and Sexual Activity  . Alcohol use: No  . Drug use: Yes    Types: Hydrocodone  . Sexual  activity: Yes  Lifestyle  . Physical activity    Days per week: Not on file    Minutes per session: Not on file  . Stress: Not on file  Relationships  . Social Herbalist on phone: Not on file    Gets together: Not on file    Attends religious service: Not on file    Active member of club or organization: Not on file    Attends meetings of clubs or organizations: Not on file    Relationship status: Not on file  . Intimate partner violence    Fear of current or ex partner: Not on file    Emotionally abused: Not on file    Physically abused: Not on file    Forced sexual activity: Not on file  Other Topics Concern  . Not on file  Social History Narrative  . Not on file    FAMILY HISTORY:  Family History  Problem Relation Age of Onset  . Cancer Maternal Aunt        breast  . Cancer Mother        vaginal  . Diabetes Father   . Cancer Sister        histocytosis    CURRENT MEDICATIONS:  Outpatient Encounter Medications as of 10/30/2018  Medication Sig Note  . ALPRAZolam (XANAX) 1 MG tablet Take 1 mg by mouth at bedtime.  11/30/2015: Received from: External Pharmacy  . cholecalciferol (VITAMIN D3) 25 MCG (1000 UT) tablet Take 1,000 Units by mouth daily.   Marland Kitchen levothyroxine (SYNTHROID, LEVOTHROID) 200 MCG tablet TAKE 1 TABLET (200 MCG TOTAL) BY MOUTH DAILY.   Marland Kitchen thiamine (VITAMIN B-1) 100 MG tablet Take 100 mg by mouth daily.   . meclizine (ANTIVERT) 25 MG tablet Take 25 mg by mouth as needed.  11/30/2015: Received from: External Pharmacy  . omeprazole (PRILOSEC) 20 MG capsule Take 20 mg by mouth as needed.    No facility-administered encounter medications on file as of 10/30/2018.     ALLERGIES:  No Known Allergies   PHYSICAL EXAM:  ECOG Performance status: 1  Vitals:   10/30/18 1049  BP: 140/77  Pulse: 97  Resp: 18  Temp: (!) 97.5 F (36.4 C)  SpO2: 97%   Filed Weights   10/30/18 1049  Weight: 208 lb 8 oz (94.6 kg)    Physical Exam Vitals signs  reviewed.  Constitutional:      Appearance: Normal appearance.  Cardiovascular:     Rate and Rhythm: Normal rate and regular rhythm.     Heart sounds: Normal heart sounds.  Pulmonary:     Effort: Pulmonary effort is normal.     Breath sounds: Normal breath sounds.  Abdominal:     General: There is no distension.  Palpations: Abdomen is soft. There is no mass.  Musculoskeletal:        General: No swelling.  Lymphadenopathy:     Cervical: No cervical adenopathy.  Skin:    General: Skin is warm.  Neurological:     General: No focal deficit present.     Mental Status: She is alert and oriented to person, place, and time.  Psychiatric:        Mood and Affect: Mood normal.        Behavior: Behavior normal.      LABORATORY DATA:  I have reviewed the labs as listed.  CBC    Component Value Date/Time   WBC 8.1 10/18/2018 0913   RBC 4.97 10/18/2018 0913   HGB 15.1 (H) 10/18/2018 0913   HCT 46.7 (H) 10/18/2018 0913   PLT 255 10/18/2018 0913   MCV 94.0 10/18/2018 0913   MCH 30.4 10/18/2018 0913   MCHC 32.3 10/18/2018 0913   RDW 13.8 10/18/2018 0913   LYMPHSABS 2.3 10/18/2018 0913   MONOABS 0.5 10/18/2018 0913   EOSABS 0.2 10/18/2018 0913   BASOSABS 0.1 10/18/2018 0913   CMP Latest Ref Rng & Units 10/18/2018 08/23/2017 08/11/2016  Glucose 70 - 99 mg/dL 142(H) 101(H) 122(H)  BUN 6 - 20 mg/dL _0 Creatinine 0.44 - 1.00 mg/dL 0.72 0.72 0.87  Sodium 135 - 145 mmol/L 140 139 139  Potassium 3.5 - 5.1 mmol/L 4.2 4.4 4.6  Chloride 98 - 111 mmol/L 105 106 101  CO2 22 - 32 mmol/L 24 21(L) 27  Calcium 8.9 - 10.3 mg/dL 9.0 9.0 9.1  Total Protein 6.5 - 8.1 g/dL 7.3 7.2 7.2  Total Bilirubin 0.3 - 1.2 mg/dL 0.1(L) 0.5 0.5  Alkaline Phos 38 - 126 U/L 98 90 93  AST 15 - 41 U/L _1 ALT 0 - 44 U/L _2 DIAGNOSTIC IMAGING:  I have independently reviewed the scans and discussed with the patient.     ASSESSMENT & PLAN:   Cancer of upper-outer quadrant of  female breast, right 1.  Stage II right breast IDC: -2.5 cm, 1/15 lymph nodes positive, ER/PR positive, HER-2 negative, Ki-67 71%. -Status post dose dense AC followed by dose dense docetaxel for 4 cycles. -She completed 5 years of tamoxifen.  She decided against continuing beyond 5 years because of severe hot flashes. -She underwent TAH and BSO in February 2020 secondary to bleeding and polyps. -We reviewed her labs which are grossly within normal limits. -Physical exam today did not reveal any palpable masses in bilateral breast.  Lumpectomy scar in the right breast is within normal limits.  No palpable adenopathy. -We reviewed mammogram dated 10/18/2018 which was BI-RADS Category 1.      Orders placed this encounter:  Orders Placed This Encounter  Procedures  . CBC with Differential/Platelet  . Comprehensive metabolic panel  . Estradiol  . Follicle stimulating hormone  . Luteinizing hormone      Derek Jack, MD [place] [phone]

## 2018-10-31 ENCOUNTER — Encounter (HOSPITAL_COMMUNITY): Payer: Self-pay | Admitting: Hematology

## 2018-10-31 NOTE — Assessment & Plan Note (Signed)
1.  Stage II right breast IDC: -2.5 cm, 1/15 lymph nodes positive, ER/PR positive, HER-2 negative, Ki-67 71%. -Status post dose dense AC followed by dose dense docetaxel for 4 cycles. -She completed 5 years of tamoxifen.  She decided against continuing beyond 5 years because of severe hot flashes. -She underwent TAH and BSO in February 2020 secondary to bleeding and polyps. -We reviewed her labs which are grossly within normal limits. -Physical exam today did not reveal any palpable masses in bilateral breast.  Lumpectomy scar in the right breast is within normal limits.  No palpable adenopathy. -We reviewed mammogram dated 10/18/2018 which was BI-RADS Category 1.

## 2018-11-16 DIAGNOSIS — E559 Vitamin D deficiency, unspecified: Secondary | ICD-10-CM | POA: Diagnosis not present

## 2018-11-16 DIAGNOSIS — Z720 Tobacco use: Secondary | ICD-10-CM | POA: Diagnosis not present

## 2018-11-16 DIAGNOSIS — E0590 Thyrotoxicosis, unspecified without thyrotoxic crisis or storm: Secondary | ICD-10-CM | POA: Diagnosis not present

## 2018-11-16 DIAGNOSIS — R5382 Chronic fatigue, unspecified: Secondary | ICD-10-CM | POA: Diagnosis not present

## 2018-11-16 DIAGNOSIS — E039 Hypothyroidism, unspecified: Secondary | ICD-10-CM | POA: Diagnosis not present

## 2018-11-16 DIAGNOSIS — I972 Postmastectomy lymphedema syndrome: Secondary | ICD-10-CM | POA: Diagnosis not present

## 2018-11-16 DIAGNOSIS — K219 Gastro-esophageal reflux disease without esophagitis: Secondary | ICD-10-CM | POA: Diagnosis not present

## 2018-11-16 DIAGNOSIS — R739 Hyperglycemia, unspecified: Secondary | ICD-10-CM | POA: Diagnosis not present

## 2018-12-18 DIAGNOSIS — Z01419 Encounter for gynecological examination (general) (routine) without abnormal findings: Secondary | ICD-10-CM | POA: Diagnosis not present

## 2018-12-18 DIAGNOSIS — Z853 Personal history of malignant neoplasm of breast: Secondary | ICD-10-CM | POA: Diagnosis not present

## 2019-05-10 DIAGNOSIS — R5382 Chronic fatigue, unspecified: Secondary | ICD-10-CM | POA: Diagnosis not present

## 2019-05-10 DIAGNOSIS — E559 Vitamin D deficiency, unspecified: Secondary | ICD-10-CM | POA: Diagnosis not present

## 2019-05-10 DIAGNOSIS — E0590 Thyrotoxicosis, unspecified without thyrotoxic crisis or storm: Secondary | ICD-10-CM | POA: Diagnosis not present

## 2019-05-10 DIAGNOSIS — E1165 Type 2 diabetes mellitus with hyperglycemia: Secondary | ICD-10-CM | POA: Diagnosis not present

## 2019-05-17 DIAGNOSIS — E039 Hypothyroidism, unspecified: Secondary | ICD-10-CM | POA: Diagnosis not present

## 2019-05-17 DIAGNOSIS — Z0000 Encounter for general adult medical examination without abnormal findings: Secondary | ICD-10-CM | POA: Diagnosis not present

## 2019-05-17 DIAGNOSIS — R5382 Chronic fatigue, unspecified: Secondary | ICD-10-CM | POA: Diagnosis not present

## 2019-05-17 DIAGNOSIS — Z720 Tobacco use: Secondary | ICD-10-CM | POA: Diagnosis not present

## 2019-05-17 DIAGNOSIS — G4733 Obstructive sleep apnea (adult) (pediatric): Secondary | ICD-10-CM | POA: Diagnosis not present

## 2019-05-17 DIAGNOSIS — Z6834 Body mass index (BMI) 34.0-34.9, adult: Secondary | ICD-10-CM | POA: Diagnosis not present

## 2019-05-17 DIAGNOSIS — E1165 Type 2 diabetes mellitus with hyperglycemia: Secondary | ICD-10-CM | POA: Diagnosis not present

## 2019-05-17 DIAGNOSIS — E559 Vitamin D deficiency, unspecified: Secondary | ICD-10-CM | POA: Diagnosis not present

## 2019-09-19 DIAGNOSIS — J069 Acute upper respiratory infection, unspecified: Secondary | ICD-10-CM | POA: Diagnosis not present

## 2019-10-28 ENCOUNTER — Inpatient Hospital Stay (HOSPITAL_COMMUNITY): Payer: No Typology Code available for payment source | Attending: Hematology

## 2019-11-04 ENCOUNTER — Ambulatory Visit (HOSPITAL_COMMUNITY): Payer: 59 | Admitting: Hematology

## 2020-01-07 ENCOUNTER — Other Ambulatory Visit (HOSPITAL_COMMUNITY): Payer: Self-pay | Admitting: Hematology

## 2020-01-07 DIAGNOSIS — Z1231 Encounter for screening mammogram for malignant neoplasm of breast: Secondary | ICD-10-CM

## 2020-01-22 ENCOUNTER — Other Ambulatory Visit: Payer: Self-pay

## 2020-01-22 ENCOUNTER — Ambulatory Visit (HOSPITAL_COMMUNITY)
Admission: RE | Admit: 2020-01-22 | Discharge: 2020-01-22 | Disposition: A | Discharge status: Home / Self Care | Payer: No Typology Code available for payment source | Source: Ambulatory Visit | Attending: Hematology | Admitting: Hematology

## 2020-01-22 DIAGNOSIS — Z1231 Encounter for screening mammogram for malignant neoplasm of breast: Secondary | ICD-10-CM | POA: Diagnosis not present

## 2020-05-13 DIAGNOSIS — R739 Hyperglycemia, unspecified: Secondary | ICD-10-CM | POA: Diagnosis not present

## 2020-05-13 DIAGNOSIS — E559 Vitamin D deficiency, unspecified: Secondary | ICD-10-CM | POA: Diagnosis not present

## 2020-05-13 DIAGNOSIS — E0590 Thyrotoxicosis, unspecified without thyrotoxic crisis or storm: Secondary | ICD-10-CM | POA: Diagnosis not present

## 2020-05-13 DIAGNOSIS — E039 Hypothyroidism, unspecified: Secondary | ICD-10-CM | POA: Diagnosis not present

## 2020-05-13 DIAGNOSIS — R5382 Chronic fatigue, unspecified: Secondary | ICD-10-CM | POA: Diagnosis not present

## 2020-05-13 DIAGNOSIS — D519 Vitamin B12 deficiency anemia, unspecified: Secondary | ICD-10-CM | POA: Diagnosis not present

## 2020-05-13 DIAGNOSIS — E1165 Type 2 diabetes mellitus with hyperglycemia: Secondary | ICD-10-CM | POA: Diagnosis not present

## 2020-05-13 DIAGNOSIS — K219 Gastro-esophageal reflux disease without esophagitis: Secondary | ICD-10-CM | POA: Diagnosis not present

## 2020-05-18 DIAGNOSIS — E039 Hypothyroidism, unspecified: Secondary | ICD-10-CM | POA: Diagnosis not present

## 2020-05-18 DIAGNOSIS — R69 Illness, unspecified: Secondary | ICD-10-CM | POA: Diagnosis not present

## 2020-05-18 DIAGNOSIS — R600 Localized edema: Secondary | ICD-10-CM | POA: Diagnosis not present

## 2020-05-18 DIAGNOSIS — E1169 Type 2 diabetes mellitus with other specified complication: Secondary | ICD-10-CM | POA: Diagnosis not present

## 2020-05-18 DIAGNOSIS — E785 Hyperlipidemia, unspecified: Secondary | ICD-10-CM | POA: Diagnosis not present

## 2020-05-18 DIAGNOSIS — Z6834 Body mass index (BMI) 34.0-34.9, adult: Secondary | ICD-10-CM | POA: Diagnosis not present

## 2020-05-18 DIAGNOSIS — Z0000 Encounter for general adult medical examination without abnormal findings: Secondary | ICD-10-CM | POA: Diagnosis not present

## 2020-07-03 DIAGNOSIS — E1165 Type 2 diabetes mellitus with hyperglycemia: Secondary | ICD-10-CM | POA: Diagnosis not present

## 2020-07-03 DIAGNOSIS — R69 Illness, unspecified: Secondary | ICD-10-CM | POA: Diagnosis not present

## 2020-07-21 DIAGNOSIS — E0500 Thyrotoxicosis with diffuse goiter without thyrotoxic crisis or storm: Secondary | ICD-10-CM | POA: Diagnosis not present

## 2020-07-21 DIAGNOSIS — Z720 Tobacco use: Secondary | ICD-10-CM | POA: Diagnosis not present

## 2020-07-21 DIAGNOSIS — E1165 Type 2 diabetes mellitus with hyperglycemia: Secondary | ICD-10-CM | POA: Diagnosis not present

## 2020-07-21 DIAGNOSIS — E890 Postprocedural hypothyroidism: Secondary | ICD-10-CM | POA: Diagnosis not present

## 2020-08-31 DIAGNOSIS — E890 Postprocedural hypothyroidism: Secondary | ICD-10-CM | POA: Diagnosis not present

## 2020-08-31 DIAGNOSIS — Z720 Tobacco use: Secondary | ICD-10-CM | POA: Diagnosis not present

## 2020-08-31 DIAGNOSIS — E1165 Type 2 diabetes mellitus with hyperglycemia: Secondary | ICD-10-CM | POA: Diagnosis not present

## 2020-08-31 DIAGNOSIS — E0500 Thyrotoxicosis with diffuse goiter without thyrotoxic crisis or storm: Secondary | ICD-10-CM | POA: Diagnosis not present

## 2020-08-31 DIAGNOSIS — E119 Type 2 diabetes mellitus without complications: Secondary | ICD-10-CM | POA: Diagnosis not present

## 2020-09-17 DIAGNOSIS — H05223 Edema of bilateral orbit: Secondary | ICD-10-CM | POA: Diagnosis not present

## 2020-09-17 DIAGNOSIS — H05243 Constant exophthalmos, bilateral: Secondary | ICD-10-CM | POA: Diagnosis not present

## 2020-09-17 DIAGNOSIS — E0500 Thyrotoxicosis with diffuse goiter without thyrotoxic crisis or storm: Secondary | ICD-10-CM | POA: Diagnosis not present

## 2020-09-17 DIAGNOSIS — H40033 Anatomical narrow angle, bilateral: Secondary | ICD-10-CM | POA: Diagnosis not present

## 2020-09-18 ENCOUNTER — Other Ambulatory Visit: Payer: Self-pay | Admitting: Ophthalmology

## 2020-09-18 DIAGNOSIS — H05249 Constant exophthalmos, unspecified eye: Secondary | ICD-10-CM

## 2020-10-22 ENCOUNTER — Ambulatory Visit: Payer: No Typology Code available for payment source | Attending: Ophthalmology | Admitting: Audiology

## 2020-10-22 ENCOUNTER — Other Ambulatory Visit: Payer: Self-pay

## 2020-10-22 DIAGNOSIS — H9193 Unspecified hearing loss, bilateral: Secondary | ICD-10-CM | POA: Insufficient documentation

## 2020-10-22 NOTE — Procedures (Signed)
  Outpatient Audiology and [place], [place]  [address] [phone]  AUDIOLOGICAL  EVALUATION  NAME: Sheila Jefferson     DOB:   1965-12-07      MRN: [id number]                                                                                     DATE: 10/22/2020     REFERENT: [name], MD STATUS: Outpatient DIAGNOSIS: Decreased hearing    History: [name] was seen for an audiological evaluation. [name]'s medical history is significant for Graves' disease, and Thyroid eye disease. [name] will starr teprotumumab at the beginning of next year for the thyroid eye disease and is here for a baseline audiogram. [name] also has a history of breast cancer, chemotherapy, and radiation.  [name] denies concerns regarding her hearing sensitivity. She denies tinnitus, otalgia, aural fullness, and vertigo. [name] reports episodes of dizziness which she describes as feeling "off-balance."  Evaluation:  Otoscopy showed a clear view of the tympanic membranes, bilaterally Tympanometry results were consistent with normal middle ear pressure and normal tympanic membrane mobility, bilaterally.  Audiometric testing was completed using Conventional Audiometry techniques with insert earphones and TDH headphones. Test results are consistent in the right ear with normal hearing sensitivity at ([phone] Hz) and in the left ear with normal hearing sensitivity at ([phone] Hz) sloping to a mild hearing loss at (6000-8000 Hz). Sensorineural asymmetry noted at 6000-8000 Hz, worse in the left ear. Speech Recognition Thresholds were obtained at 10 dB HL in the right ear and at 15  dB HL in the left ear. Word Recognition Testing was completed at 50 dB HL and Erricka scored 100%, bilaterally.    Results:  Today's results are consistent in the right ear with normal hearing sensitivity at ([phone] Hz) and in the left ear with normal hearing sensitivity at ([phone] Hz) sloping to a mild hearing loss at  (6000-8000 Hz). Sensorineural asymmetry noted at 6000-8000 Hz, worse in the left ear. [name] may have hearing difficulty in adverse listening situations and she will benefit from the use of good communication strategies. The test results were reviewed with [facility].   Recommendations: 1.   Continue to monitor hearing while on the medication Teprotumumab.     If you have any questions please feel free to contact me at [phone].  Bari Mantis Audiologist, Au.D., CCC-A 10/22/2020  3:09 PM  Cc: [name], MD

## 2020-10-24 ENCOUNTER — Other Ambulatory Visit: Payer: Self-pay

## 2020-10-24 ENCOUNTER — Ambulatory Visit
Admission: RE | Admit: 2020-10-24 | Discharge: 2020-10-24 | Disposition: A | Discharge status: Home / Self Care | Payer: No Typology Code available for payment source | Source: Ambulatory Visit | Attending: Ophthalmology | Admitting: Ophthalmology

## 2020-10-24 DIAGNOSIS — H0520 Unspecified exophthalmos: Secondary | ICD-10-CM | POA: Diagnosis not present

## 2020-10-24 DIAGNOSIS — H05249 Constant exophthalmos, unspecified eye: Secondary | ICD-10-CM

## 2020-10-24 DIAGNOSIS — M6289 Other specified disorders of muscle: Secondary | ICD-10-CM | POA: Diagnosis not present

## 2020-10-28 DIAGNOSIS — E0500 Thyrotoxicosis with diffuse goiter without thyrotoxic crisis or storm: Secondary | ICD-10-CM | POA: Diagnosis not present

## 2020-12-02 DIAGNOSIS — H40051 Ocular hypertension, right eye: Secondary | ICD-10-CM | POA: Diagnosis not present

## 2020-12-02 DIAGNOSIS — E0500 Thyrotoxicosis with diffuse goiter without thyrotoxic crisis or storm: Secondary | ICD-10-CM | POA: Diagnosis not present

## 2020-12-02 DIAGNOSIS — H05243 Constant exophthalmos, bilateral: Secondary | ICD-10-CM | POA: Diagnosis not present

## 2020-12-02 DIAGNOSIS — H40033 Anatomical narrow angle, bilateral: Secondary | ICD-10-CM | POA: Diagnosis not present

## 2020-12-02 DIAGNOSIS — H05223 Edema of bilateral orbit: Secondary | ICD-10-CM | POA: Diagnosis not present

## 2021-02-20 DIAGNOSIS — E0500 Thyrotoxicosis with diffuse goiter without thyrotoxic crisis or storm: Secondary | ICD-10-CM | POA: Diagnosis not present

## 2021-03-03 DIAGNOSIS — R5382 Chronic fatigue, unspecified: Secondary | ICD-10-CM | POA: Diagnosis not present

## 2021-03-03 DIAGNOSIS — R600 Localized edema: Secondary | ICD-10-CM | POA: Diagnosis not present

## 2021-03-03 DIAGNOSIS — E039 Hypothyroidism, unspecified: Secondary | ICD-10-CM | POA: Diagnosis not present

## 2021-03-03 DIAGNOSIS — H5789 Other specified disorders of eye and adnexa: Secondary | ICD-10-CM | POA: Diagnosis not present

## 2021-03-03 DIAGNOSIS — Z6834 Body mass index (BMI) 34.0-34.9, adult: Secondary | ICD-10-CM | POA: Diagnosis not present

## 2021-03-03 DIAGNOSIS — E1165 Type 2 diabetes mellitus with hyperglycemia: Secondary | ICD-10-CM | POA: Diagnosis not present

## 2021-03-03 DIAGNOSIS — R69 Illness, unspecified: Secondary | ICD-10-CM | POA: Diagnosis not present

## 2021-03-03 DIAGNOSIS — E559 Vitamin D deficiency, unspecified: Secondary | ICD-10-CM | POA: Diagnosis not present

## 2021-03-13 DIAGNOSIS — E0500 Thyrotoxicosis with diffuse goiter without thyrotoxic crisis or storm: Secondary | ICD-10-CM | POA: Diagnosis not present

## 2021-04-01 DIAGNOSIS — H40051 Ocular hypertension, right eye: Secondary | ICD-10-CM | POA: Diagnosis not present

## 2021-04-01 DIAGNOSIS — R69 Illness, unspecified: Secondary | ICD-10-CM | POA: Diagnosis not present

## 2021-04-01 DIAGNOSIS — H40033 Anatomical narrow angle, bilateral: Secondary | ICD-10-CM | POA: Diagnosis not present

## 2021-04-01 DIAGNOSIS — H05243 Constant exophthalmos, bilateral: Secondary | ICD-10-CM | POA: Diagnosis not present

## 2021-04-01 DIAGNOSIS — H05223 Edema of bilateral orbit: Secondary | ICD-10-CM | POA: Diagnosis not present

## 2021-04-01 DIAGNOSIS — E0500 Thyrotoxicosis with diffuse goiter without thyrotoxic crisis or storm: Secondary | ICD-10-CM | POA: Diagnosis not present

## 2021-04-04 DIAGNOSIS — E0500 Thyrotoxicosis with diffuse goiter without thyrotoxic crisis or storm: Secondary | ICD-10-CM | POA: Diagnosis not present

## 2021-04-24 DIAGNOSIS — E0500 Thyrotoxicosis with diffuse goiter without thyrotoxic crisis or storm: Secondary | ICD-10-CM | POA: Diagnosis not present

## 2021-04-25 DIAGNOSIS — E0500 Thyrotoxicosis with diffuse goiter without thyrotoxic crisis or storm: Secondary | ICD-10-CM | POA: Diagnosis not present

## 2021-04-27 DIAGNOSIS — E119 Type 2 diabetes mellitus without complications: Secondary | ICD-10-CM | POA: Diagnosis not present

## 2021-04-27 DIAGNOSIS — E890 Postprocedural hypothyroidism: Secondary | ICD-10-CM | POA: Diagnosis not present

## 2021-04-27 DIAGNOSIS — K921 Melena: Secondary | ICD-10-CM | POA: Diagnosis not present

## 2021-04-27 DIAGNOSIS — E0500 Thyrotoxicosis with diffuse goiter without thyrotoxic crisis or storm: Secondary | ICD-10-CM | POA: Diagnosis not present

## 2021-04-27 DIAGNOSIS — Z720 Tobacco use: Secondary | ICD-10-CM | POA: Diagnosis not present

## 2021-05-17 DIAGNOSIS — E0500 Thyrotoxicosis with diffuse goiter without thyrotoxic crisis or storm: Secondary | ICD-10-CM | POA: Diagnosis not present

## 2021-06-01 DIAGNOSIS — H40033 Anatomical narrow angle, bilateral: Secondary | ICD-10-CM | POA: Diagnosis not present

## 2021-06-01 DIAGNOSIS — H01002 Unspecified blepharitis right lower eyelid: Secondary | ICD-10-CM | POA: Diagnosis not present

## 2021-06-01 DIAGNOSIS — H01004 Unspecified blepharitis left upper eyelid: Secondary | ICD-10-CM | POA: Diagnosis not present

## 2021-06-01 DIAGNOSIS — H01001 Unspecified blepharitis right upper eyelid: Secondary | ICD-10-CM | POA: Diagnosis not present

## 2021-06-05 DIAGNOSIS — E0500 Thyrotoxicosis with diffuse goiter without thyrotoxic crisis or storm: Secondary | ICD-10-CM | POA: Diagnosis not present

## 2021-06-07 DIAGNOSIS — H40031 Anatomical narrow angle, right eye: Secondary | ICD-10-CM | POA: Diagnosis not present

## 2021-06-24 DIAGNOSIS — H40032 Anatomical narrow angle, left eye: Secondary | ICD-10-CM | POA: Diagnosis not present

## 2021-06-27 DIAGNOSIS — E0500 Thyrotoxicosis with diffuse goiter without thyrotoxic crisis or storm: Secondary | ICD-10-CM | POA: Diagnosis not present

## 2021-07-18 DIAGNOSIS — E0500 Thyrotoxicosis with diffuse goiter without thyrotoxic crisis or storm: Secondary | ICD-10-CM | POA: Diagnosis not present

## 2021-07-22 DIAGNOSIS — H40051 Ocular hypertension, right eye: Secondary | ICD-10-CM | POA: Diagnosis not present

## 2021-07-22 DIAGNOSIS — H05243 Constant exophthalmos, bilateral: Secondary | ICD-10-CM | POA: Diagnosis not present

## 2021-07-22 DIAGNOSIS — H04123 Dry eye syndrome of bilateral lacrimal glands: Secondary | ICD-10-CM | POA: Diagnosis not present

## 2021-07-22 DIAGNOSIS — H02422 Myogenic ptosis of left eyelid: Secondary | ICD-10-CM | POA: Diagnosis not present

## 2021-07-22 DIAGNOSIS — E0500 Thyrotoxicosis with diffuse goiter without thyrotoxic crisis or storm: Secondary | ICD-10-CM | POA: Diagnosis not present

## 2021-07-22 DIAGNOSIS — R69 Illness, unspecified: Secondary | ICD-10-CM | POA: Diagnosis not present

## 2021-07-22 DIAGNOSIS — H40033 Anatomical narrow angle, bilateral: Secondary | ICD-10-CM | POA: Diagnosis not present

## 2021-07-29 DIAGNOSIS — H40033 Anatomical narrow angle, bilateral: Secondary | ICD-10-CM | POA: Diagnosis not present

## 2021-07-29 DIAGNOSIS — H01001 Unspecified blepharitis right upper eyelid: Secondary | ICD-10-CM | POA: Diagnosis not present

## 2021-07-29 DIAGNOSIS — H01002 Unspecified blepharitis right lower eyelid: Secondary | ICD-10-CM | POA: Diagnosis not present

## 2021-07-29 DIAGNOSIS — H01004 Unspecified blepharitis left upper eyelid: Secondary | ICD-10-CM | POA: Diagnosis not present

## 2021-08-10 DIAGNOSIS — K921 Melena: Secondary | ICD-10-CM | POA: Diagnosis not present

## 2021-08-10 DIAGNOSIS — K219 Gastro-esophageal reflux disease without esophagitis: Secondary | ICD-10-CM | POA: Diagnosis not present

## 2021-10-14 DIAGNOSIS — K625 Hemorrhage of anus and rectum: Secondary | ICD-10-CM | POA: Diagnosis not present

## 2021-10-14 DIAGNOSIS — K2930 Chronic superficial gastritis without bleeding: Secondary | ICD-10-CM | POA: Diagnosis not present

## 2021-10-14 DIAGNOSIS — K2961 Other gastritis with bleeding: Secondary | ICD-10-CM | POA: Diagnosis not present

## 2021-10-14 DIAGNOSIS — K648 Other hemorrhoids: Secondary | ICD-10-CM | POA: Diagnosis not present

## 2021-10-14 DIAGNOSIS — R12 Heartburn: Secondary | ICD-10-CM | POA: Diagnosis not present

## 2022-01-10 DIAGNOSIS — D3131 Benign neoplasm of right choroid: Secondary | ICD-10-CM | POA: Diagnosis not present

## 2022-01-10 DIAGNOSIS — H524 Presbyopia: Secondary | ICD-10-CM | POA: Diagnosis not present

## 2022-02-07 DIAGNOSIS — E0500 Thyrotoxicosis with diffuse goiter without thyrotoxic crisis or storm: Secondary | ICD-10-CM | POA: Diagnosis not present

## 2022-02-07 DIAGNOSIS — Z8639 Personal history of other endocrine, nutritional and metabolic disease: Secondary | ICD-10-CM | POA: Diagnosis not present

## 2022-02-07 DIAGNOSIS — Z853 Personal history of malignant neoplasm of breast: Secondary | ICD-10-CM | POA: Diagnosis not present

## 2022-02-07 DIAGNOSIS — E890 Postprocedural hypothyroidism: Secondary | ICD-10-CM | POA: Diagnosis not present

## 2022-02-07 DIAGNOSIS — R2689 Other abnormalities of gait and mobility: Secondary | ICD-10-CM | POA: Diagnosis not present

## 2022-02-07 DIAGNOSIS — Z720 Tobacco use: Secondary | ICD-10-CM | POA: Diagnosis not present

## 2022-02-07 DIAGNOSIS — E119 Type 2 diabetes mellitus without complications: Secondary | ICD-10-CM | POA: Diagnosis not present

## 2022-02-07 DIAGNOSIS — R0683 Snoring: Secondary | ICD-10-CM | POA: Diagnosis not present

## 2022-02-07 DIAGNOSIS — R5383 Other fatigue: Secondary | ICD-10-CM | POA: Diagnosis not present

## 2022-02-07 DIAGNOSIS — M25512 Pain in left shoulder: Secondary | ICD-10-CM | POA: Diagnosis not present

## 2022-06-01 DIAGNOSIS — F17210 Nicotine dependence, cigarettes, uncomplicated: Secondary | ICD-10-CM | POA: Diagnosis not present

## 2022-06-01 DIAGNOSIS — Z6834 Body mass index (BMI) 34.0-34.9, adult: Secondary | ICD-10-CM | POA: Diagnosis not present

## 2022-06-01 DIAGNOSIS — L918 Other hypertrophic disorders of the skin: Secondary | ICD-10-CM | POA: Diagnosis not present

## 2022-06-08 DIAGNOSIS — R030 Elevated blood-pressure reading, without diagnosis of hypertension: Secondary | ICD-10-CM | POA: Diagnosis not present

## 2022-06-08 DIAGNOSIS — Z6834 Body mass index (BMI) 34.0-34.9, adult: Secondary | ICD-10-CM | POA: Diagnosis not present

## 2022-06-08 DIAGNOSIS — R609 Edema, unspecified: Secondary | ICD-10-CM | POA: Diagnosis not present

## 2022-06-08 DIAGNOSIS — F17210 Nicotine dependence, cigarettes, uncomplicated: Secondary | ICD-10-CM | POA: Diagnosis not present

## 2022-06-08 DIAGNOSIS — M25512 Pain in left shoulder: Secondary | ICD-10-CM | POA: Diagnosis not present

## 2022-06-08 DIAGNOSIS — M26622 Arthralgia of left temporomandibular joint: Secondary | ICD-10-CM | POA: Diagnosis not present

## 2022-06-21 DIAGNOSIS — E119 Type 2 diabetes mellitus without complications: Secondary | ICD-10-CM | POA: Diagnosis not present

## 2022-06-21 DIAGNOSIS — Z720 Tobacco use: Secondary | ICD-10-CM | POA: Diagnosis not present

## 2022-06-21 DIAGNOSIS — E0500 Thyrotoxicosis with diffuse goiter without thyrotoxic crisis or storm: Secondary | ICD-10-CM | POA: Diagnosis not present

## 2022-06-21 DIAGNOSIS — E890 Postprocedural hypothyroidism: Secondary | ICD-10-CM | POA: Diagnosis not present

## 2022-06-27 DIAGNOSIS — R609 Edema, unspecified: Secondary | ICD-10-CM | POA: Diagnosis not present

## 2022-06-27 DIAGNOSIS — R600 Localized edema: Secondary | ICD-10-CM | POA: Diagnosis not present

## 2022-07-20 ENCOUNTER — Other Ambulatory Visit (HOSPITAL_COMMUNITY): Payer: Self-pay | Admitting: Hematology

## 2022-07-20 DIAGNOSIS — Z1231 Encounter for screening mammogram for malignant neoplasm of breast: Secondary | ICD-10-CM

## 2022-08-01 ENCOUNTER — Ambulatory Visit (HOSPITAL_COMMUNITY)
Admission: RE | Admit: 2022-08-01 | Discharge: 2022-08-01 | Disposition: A | Discharge status: Home / Self Care | Payer: 59 | Source: Ambulatory Visit | Attending: Hematology | Admitting: Hematology

## 2022-08-01 ENCOUNTER — Encounter (HOSPITAL_COMMUNITY): Payer: Self-pay

## 2022-08-01 DIAGNOSIS — Z1231 Encounter for screening mammogram for malignant neoplasm of breast: Secondary | ICD-10-CM | POA: Diagnosis not present

## 2022-08-18 DIAGNOSIS — Z0100 Encounter for examination of eyes and vision without abnormal findings: Secondary | ICD-10-CM | POA: Diagnosis not present

## 2022-09-28 DIAGNOSIS — Z1151 Encounter for screening for human papillomavirus (HPV): Secondary | ICD-10-CM | POA: Diagnosis not present

## 2022-09-28 DIAGNOSIS — Z01419 Encounter for gynecological examination (general) (routine) without abnormal findings: Secondary | ICD-10-CM | POA: Diagnosis not present

## 2022-09-28 DIAGNOSIS — Z1272 Encounter for screening for malignant neoplasm of vagina: Secondary | ICD-10-CM | POA: Diagnosis not present

## 2022-10-10 DIAGNOSIS — H40033 Anatomical narrow angle, bilateral: Secondary | ICD-10-CM | POA: Diagnosis not present

## 2022-10-10 DIAGNOSIS — H01002 Unspecified blepharitis right lower eyelid: Secondary | ICD-10-CM | POA: Diagnosis not present

## 2022-10-10 DIAGNOSIS — H01004 Unspecified blepharitis left upper eyelid: Secondary | ICD-10-CM | POA: Diagnosis not present

## 2022-10-10 DIAGNOSIS — H01001 Unspecified blepharitis right upper eyelid: Secondary | ICD-10-CM | POA: Diagnosis not present

## 2022-11-07 DIAGNOSIS — H01001 Unspecified blepharitis right upper eyelid: Secondary | ICD-10-CM | POA: Diagnosis not present

## 2022-11-07 DIAGNOSIS — H40033 Anatomical narrow angle, bilateral: Secondary | ICD-10-CM | POA: Diagnosis not present

## 2022-11-07 DIAGNOSIS — H01002 Unspecified blepharitis right lower eyelid: Secondary | ICD-10-CM | POA: Diagnosis not present

## 2022-11-07 DIAGNOSIS — H01004 Unspecified blepharitis left upper eyelid: Secondary | ICD-10-CM | POA: Diagnosis not present

## 2023-01-12 DIAGNOSIS — E890 Postprocedural hypothyroidism: Secondary | ICD-10-CM | POA: Diagnosis not present

## 2023-01-12 DIAGNOSIS — Z8639 Personal history of other endocrine, nutritional and metabolic disease: Secondary | ICD-10-CM | POA: Diagnosis not present

## 2023-01-12 DIAGNOSIS — R5383 Other fatigue: Secondary | ICD-10-CM | POA: Diagnosis not present

## 2023-01-12 DIAGNOSIS — E0500 Thyrotoxicosis with diffuse goiter without thyrotoxic crisis or storm: Secondary | ICD-10-CM | POA: Diagnosis not present

## 2023-01-12 DIAGNOSIS — E119 Type 2 diabetes mellitus without complications: Secondary | ICD-10-CM | POA: Diagnosis not present

## 2023-01-12 DIAGNOSIS — Z720 Tobacco use: Secondary | ICD-10-CM | POA: Diagnosis not present

## 2023-03-28 DIAGNOSIS — H524 Presbyopia: Secondary | ICD-10-CM | POA: Diagnosis not present

## 2023-03-28 DIAGNOSIS — H40033 Anatomical narrow angle, bilateral: Secondary | ICD-10-CM | POA: Diagnosis not present

## 2023-03-28 DIAGNOSIS — H5203 Hypermetropia, bilateral: Secondary | ICD-10-CM | POA: Diagnosis not present

## 2023-03-30 DIAGNOSIS — E0500 Thyrotoxicosis with diffuse goiter without thyrotoxic crisis or storm: Secondary | ICD-10-CM | POA: Diagnosis not present

## 2023-03-30 DIAGNOSIS — H4710 Unspecified papilledema: Secondary | ICD-10-CM | POA: Diagnosis not present

## 2023-03-30 DIAGNOSIS — E890 Postprocedural hypothyroidism: Secondary | ICD-10-CM | POA: Diagnosis not present

## 2023-03-30 DIAGNOSIS — E119 Type 2 diabetes mellitus without complications: Secondary | ICD-10-CM | POA: Diagnosis not present

## 2023-04-15 DIAGNOSIS — H4710 Unspecified papilledema: Secondary | ICD-10-CM | POA: Diagnosis not present

## 2023-04-15 DIAGNOSIS — E0500 Thyrotoxicosis with diffuse goiter without thyrotoxic crisis or storm: Secondary | ICD-10-CM | POA: Diagnosis not present

## 2023-04-15 DIAGNOSIS — H059 Unspecified disorder of orbit: Secondary | ICD-10-CM | POA: Diagnosis not present

## 2023-04-15 DIAGNOSIS — E119 Type 2 diabetes mellitus without complications: Secondary | ICD-10-CM | POA: Diagnosis not present

## 2023-05-08 DIAGNOSIS — F17210 Nicotine dependence, cigarettes, uncomplicated: Secondary | ICD-10-CM | POA: Diagnosis not present

## 2023-05-11 DIAGNOSIS — H01005 Unspecified blepharitis left lower eyelid: Secondary | ICD-10-CM | POA: Diagnosis not present

## 2023-05-11 DIAGNOSIS — H01002 Unspecified blepharitis right lower eyelid: Secondary | ICD-10-CM | POA: Diagnosis not present

## 2023-05-11 DIAGNOSIS — H01001 Unspecified blepharitis right upper eyelid: Secondary | ICD-10-CM | POA: Diagnosis not present

## 2023-05-11 DIAGNOSIS — H10423 Simple chronic conjunctivitis, bilateral: Secondary | ICD-10-CM | POA: Diagnosis not present

## 2023-05-11 DIAGNOSIS — H40033 Anatomical narrow angle, bilateral: Secondary | ICD-10-CM | POA: Diagnosis not present

## 2023-05-11 DIAGNOSIS — H25093 Other age-related incipient cataract, bilateral: Secondary | ICD-10-CM | POA: Diagnosis not present

## 2023-05-11 DIAGNOSIS — H02831 Dermatochalasis of right upper eyelid: Secondary | ICD-10-CM | POA: Diagnosis not present

## 2023-05-11 DIAGNOSIS — H01004 Unspecified blepharitis left upper eyelid: Secondary | ICD-10-CM | POA: Diagnosis not present

## 2023-05-11 DIAGNOSIS — H02834 Dermatochalasis of left upper eyelid: Secondary | ICD-10-CM | POA: Diagnosis not present

## 2023-05-11 DIAGNOSIS — H4711 Papilledema associated with increased intracranial pressure: Secondary | ICD-10-CM | POA: Diagnosis not present

## 2023-05-11 DIAGNOSIS — H11152 Pinguecula, left eye: Secondary | ICD-10-CM | POA: Diagnosis not present

## 2023-05-22 ENCOUNTER — Encounter: Payer: Self-pay | Admitting: Neurology

## 2023-05-22 DIAGNOSIS — H40033 Anatomical narrow angle, bilateral: Secondary | ICD-10-CM | POA: Diagnosis not present

## 2023-06-12 NOTE — Progress Notes (Unsigned)
 NEUROLOGY CONSULTATION NOTE  Sheila Jefferson MRN: [id number] DOB: 25-Sep-1965  Referring provider: Gordy Lauber, MD Primary care provider: Belvie Boyers, MD  Reason for consult:  papilledema  Assessment/Plan:   Papilledema bilaterally Acquired hypothyroidism s/p treatment for Graves disease   Papilledema noted on exam by her optometrist.  MRI findings consistent findings from thyroid  disease but no findings which may be seen in increased intracranial hypertension.  Patient does not endorse clinical symptoms consistent with increased idiopathic intracranial hypertension.  I would like to first obtain notes from her most recent ophthalmologist visit for review.  Further recommendations pending results.    Total time spent in chart and face to face with patient:  64 minutes.  Subjective:  Sheila Jefferson is a 58 year old right-handed female with Graves disease, DM 2 and history of breast cancer who presents for papilledema.  History supplemented by optometry, ophthalmology and referring provider's note.   Patient has acquired hypothyroidism due to treatment of Graves disease with thyroid -related proptosis.  She had an eye exam by her optometrist in March whose exam revealed new indistinct disc margins bilaterally (left worse than right), concerning for papilledema, which would be a new finding.  She had an MRI of the brain with and without contrast on 04/15/2023 which revealed thyroid  associated orbitopathy, including exophthalmos with bilateral rectus muscle belly enlargement and crowding of both optic nerves at the orbital apex, but no acute intracranial abnormality.  She has eye soreness with eye movement.  She denies headache, visual obscurations, dizziness or pulsatile tinnitus.  She reports about a 10 lb weight gain over the past year.  Patient is a glaucoma suspect and is followed by Dr. Tarri Farm, an ophthalmologist who specializes in glaucoma who has previously performed laser peripheral  iridotomy.  She reports that her most recent visit with him was just a couple of weeks ago.  I have his notes from 2023 and 2024.   Hgb A1c from January was 7.1.  TSH from March was 1.10 She is prescribed furosemide  which she only takes as needed for lower extremity edema.    PAST MEDICAL HISTORY: Past Medical History:  Diagnosis Date   Bulging lumbar disc    Cancer of upper-outer quadrant of female breast ([facility]) 01/06/2011   right breast   GERD (gastroesophageal reflux disease)     PAST SURGICAL HISTORY: Past Surgical History:  Procedure Laterality Date   BREAST LUMPECTOMY  02/11/2011   right breast and axillary dissection   BREAST REDUCTION SURGERY  1997   CHOLECYSTECTOMY  2003   COLONOSCOPY     pilonidal cyst     1980-81-83   PORT-A-CATH REMOVAL  11/10/2011   Procedure: MINOR REMOVAL PORT-A-CATH;  Surgeon: Darcella Earnest, MD;  [facility];  Service: General;  Laterality: Left;   PORTACATH PLACEMENT  02/11/2011   Procedure: INSERTION PORT-A-CATH;  Surgeon: Darcella Earnest, MD;  Location[facility];  Service: General;  Laterality: Left;  INSERTION PORT-A-CATH LEFT SUBCLAVIAN   REDUCTION MAMMAPLASTY Bilateral    TUBAL LIGATION  2003   UTERINE FIBROID SURGERY  09/30/2011   removal 8 polyps - Dr Gailen Journey    MEDICATIONS: Current Outpatient Medications on File Prior to Visit  Medication Sig Dispense Refill   ALPRAZolam  (XANAX ) 1 MG tablet Take 1 mg by mouth at bedtime.      cholecalciferol (VITAMIN D3) 25 MCG (1000 UT) tablet Take 1,000 Units by mouth daily.     levothyroxine  (SYNTHROID , LEVOTHROID) 200 MCG  tablet TAKE 1 TABLET (200 MCG TOTAL) BY MOUTH DAILY. 90 tablet 2   meclizine (ANTIVERT) 25 MG tablet Take 25 mg by mouth as needed.      omeprazole (PRILOSEC) 20 MG capsule Take 20 mg by mouth as needed.     thiamine (VITAMIN B-1) 100 MG tablet Take 100 mg by mouth daily.     No current facility-administered medications on file prior to visit.     ALLERGIES: No Known Allergies  FAMILY HISTORY: Family History  Problem Relation Age of Onset   Cancer Mother        vaginal   Diabetes Father    Cancer Sister        histocytosis   Cancer Maternal Aunt        breast   Breast cancer Neg Hx     Objective:  Blood pressure 134/79, pulse 95, height 5\' 5"  (1.651 m), last menstrual period 01/06/2011, SpO2 97%. General: No acute distress.  Patient appears well-groomed.   Head:  Normocephalic/atraumatic Eyes:  fundi examined but not visualized Neck: supple, no paraspinal tenderness, full range of motion Back: No paraspinal tenderness Heart: regular rate and rhythm Lungs: Clear to auscultation bilaterally. Vascular: No carotid bruits. Neurological Exam: Mental status: alert and oriented to person, place, and time, speech fluent and not dysarthric, language intact. Cranial nerves: CN I: not tested CN II: pupils equal, round and reactive to light, visual fields intact CN III, IV, VI:  full range of motion, no nystagmus, no ptosis CN V: facial sensation intact. CN VII: upper and lower face symmetric CN VIII: hearing intact CN IX, X: gag intact, uvula midline CN XI: sternocleidomastoid and trapezius muscles intact CN XII: tongue midline Bulk & Tone: normal, no fasciculations. Motor:  muscle strength 5/5 throughout Sensation:  Pinprick, temperature and vibratory sensation intact. Deep Tendon Reflexes:  2+ throughout,  toes downgoing.   Finger to nose testing:  Without dysmetria.   Heel to shin:  Without dysmetria.   Gait:  Normal station and stride.  Romberg negative.    Thank you for allowing me to take part in the care of this patient.  Janne Members, DO  CC:  Belvie Boyers, MD  Gordy Lauber, MD

## 2023-06-13 ENCOUNTER — Ambulatory Visit: Admitting: Neurology

## 2023-06-13 ENCOUNTER — Encounter: Payer: Self-pay | Admitting: Neurology

## 2023-06-13 VITALS — BP 134/79 | HR 95 | Ht 65.0 in

## 2023-06-13 DIAGNOSIS — E890 Postprocedural hypothyroidism: Secondary | ICD-10-CM

## 2023-06-13 DIAGNOSIS — H4710 Unspecified papilledema: Secondary | ICD-10-CM

## 2023-06-13 NOTE — Patient Instructions (Signed)
 Will get notes from Dr. Adora Hoover and will contact you about next steps.
